# Patient Record
Sex: Male | Born: 1955 | Race: White | Hispanic: No | Marital: Married | Smoking: Former smoker
Health system: Southern US, Community
[De-identification: ages and names within clinical notes are randomized; demographics above are authoritative.]

## PROBLEM LIST (undated history)

## (undated) DIAGNOSIS — I499 Cardiac arrhythmia, unspecified: Secondary | ICD-10-CM

## (undated) DIAGNOSIS — T4145XA Adverse effect of unspecified anesthetic, initial encounter: Secondary | ICD-10-CM

## (undated) DIAGNOSIS — T8859XA Other complications of anesthesia, initial encounter: Secondary | ICD-10-CM

## (undated) DIAGNOSIS — F419 Anxiety disorder, unspecified: Secondary | ICD-10-CM

## (undated) DIAGNOSIS — K2990 Gastroduodenitis, unspecified, without bleeding: Secondary | ICD-10-CM

## (undated) DIAGNOSIS — Z8371 Family history of colonic polyps: Secondary | ICD-10-CM

## (undated) DIAGNOSIS — M5431 Sciatica, right side: Secondary | ICD-10-CM

## (undated) DIAGNOSIS — K219 Gastro-esophageal reflux disease without esophagitis: Secondary | ICD-10-CM

## (undated) DIAGNOSIS — R002 Palpitations: Secondary | ICD-10-CM

## (undated) DIAGNOSIS — M1990 Unspecified osteoarthritis, unspecified site: Secondary | ICD-10-CM

## (undated) DIAGNOSIS — Z83719 Family history of colon polyps, unspecified: Secondary | ICD-10-CM

## (undated) DIAGNOSIS — J309 Allergic rhinitis, unspecified: Secondary | ICD-10-CM

## (undated) DIAGNOSIS — F32A Depression, unspecified: Secondary | ICD-10-CM

## (undated) DIAGNOSIS — K269 Duodenal ulcer, unspecified as acute or chronic, without hemorrhage or perforation: Secondary | ICD-10-CM

## (undated) DIAGNOSIS — S0285XA Fracture of orbit, unspecified, initial encounter for closed fracture: Secondary | ICD-10-CM

## (undated) DIAGNOSIS — K649 Unspecified hemorrhoids: Secondary | ICD-10-CM

## (undated) DIAGNOSIS — F329 Major depressive disorder, single episode, unspecified: Secondary | ICD-10-CM

## (undated) DIAGNOSIS — R000 Tachycardia, unspecified: Secondary | ICD-10-CM

## (undated) HISTORY — PX: FRACTURE SURGERY: SHX138

## (undated) HISTORY — PX: TIBIA FRACTURE SURGERY: SHX806

## (undated) HISTORY — PX: HERNIA REPAIR: SHX51

## (undated) HISTORY — PX: CARDIAC CATHETERIZATION: SHX172

## (undated) HISTORY — PX: CARPAL TUNNEL RELEASE: SHX101

## (undated) HISTORY — PX: VASECTOMY: SHX75

## (undated) HISTORY — PX: KNEE ARTHROSCOPY: SHX127

---

## 2003-05-30 ENCOUNTER — Other Ambulatory Visit: Payer: Self-pay

## 2004-12-22 ENCOUNTER — Ambulatory Visit: Payer: Self-pay | Admitting: Internal Medicine

## 2005-12-28 ENCOUNTER — Ambulatory Visit: Payer: Self-pay | Admitting: Internal Medicine

## 2006-04-24 ENCOUNTER — Inpatient Hospital Stay: Payer: Self-pay | Admitting: Unknown Physician Specialty

## 2008-06-20 ENCOUNTER — Ambulatory Visit: Payer: Self-pay | Admitting: Unknown Physician Specialty

## 2012-09-27 ENCOUNTER — Ambulatory Visit: Payer: Self-pay | Admitting: Unknown Physician Specialty

## 2013-03-20 ENCOUNTER — Ambulatory Visit: Payer: Self-pay | Admitting: Specialist

## 2013-03-30 ENCOUNTER — Emergency Department: Payer: Self-pay | Admitting: Emergency Medicine

## 2013-03-30 ENCOUNTER — Ambulatory Visit: Payer: Self-pay | Admitting: Orthopaedic Surgery

## 2013-03-30 LAB — CBC WITH DIFFERENTIAL/PLATELET
Basophil #: 0.1 10*3/uL (ref 0.0–0.1)
Basophil %: 1 %
Eosinophil %: 6.6 %
HCT: 45 % (ref 40.0–52.0)
HGB: 15.3 g/dL (ref 13.0–18.0)
Monocyte #: 0.6 x10 3/mm (ref 0.2–1.0)
Monocyte %: 8.5 %
Neutrophil %: 57.7 %
Platelet: 176 10*3/uL (ref 150–440)
RBC: 4.84 10*6/uL (ref 4.40–5.90)
RDW: 13.1 % (ref 11.5–14.5)

## 2013-03-30 LAB — BASIC METABOLIC PANEL
BUN: 16 mg/dL (ref 7–18)
Calcium, Total: 10 mg/dL (ref 8.5–10.1)
Chloride: 106 mmol/L (ref 98–107)
Co2: 28 mmol/L (ref 21–32)
EGFR (Non-African Amer.): >60
Glucose: 91 mg/dL (ref 65–99)
Potassium: 4.2 mmol/L (ref 3.5–5.1)
Sodium: 138 mmol/L (ref 136–145)

## 2013-03-30 LAB — SEDIMENTATION RATE: Erythrocyte Sed Rate: 3 mm/hr (ref 0–20)

## 2013-08-25 ENCOUNTER — Ambulatory Visit: Payer: Self-pay | Admitting: Unknown Physician Specialty

## 2013-08-30 LAB — PATHOLOGY REPORT

## 2013-10-20 DIAGNOSIS — M5030 Other cervical disc degeneration, unspecified cervical region: Secondary | ICD-10-CM | POA: Insufficient documentation

## 2013-10-20 DIAGNOSIS — M5412 Radiculopathy, cervical region: Secondary | ICD-10-CM | POA: Insufficient documentation

## 2013-10-20 DIAGNOSIS — M62838 Other muscle spasm: Secondary | ICD-10-CM | POA: Insufficient documentation

## 2013-10-23 DIAGNOSIS — K2990 Gastroduodenitis, unspecified, without bleeding: Secondary | ICD-10-CM | POA: Insufficient documentation

## 2013-10-23 DIAGNOSIS — Z8371 Family history of colonic polyps: Secondary | ICD-10-CM | POA: Insufficient documentation

## 2013-10-23 DIAGNOSIS — K269 Duodenal ulcer, unspecified as acute or chronic, without hemorrhage or perforation: Secondary | ICD-10-CM | POA: Insufficient documentation

## 2014-01-16 ENCOUNTER — Emergency Department: Payer: Self-pay | Admitting: Emergency Medicine

## 2014-01-17 LAB — URINALYSIS, COMPLETE
BILIRUBIN, UR: NEGATIVE
BLOOD: NEGATIVE
Glucose,UR: NEGATIVE mg/dL (ref 0–75)
Ketone: NEGATIVE
Leukocyte Esterase: NEGATIVE
NITRITE: NEGATIVE
Ph: 7 (ref 4.5–8.0)
Protein: NEGATIVE
RBC,UR: <1 /HPF (ref 0–5)
Specific Gravity: 1.013 (ref 1.003–1.030)
Squamous Epithelial: NONE SEEN

## 2014-04-03 ENCOUNTER — Ambulatory Visit: Payer: Self-pay | Admitting: Unknown Physician Specialty

## 2014-08-24 NOTE — Consult Note (Signed)
CC: Right Draining Carpal Tunnel Wound 59 y/o man who had right carpal tunnel release on Nov 17 bu Dr Tamala Julian comes to the ER with an open draining wound.  He states that this past monday he was seen in clinic and found to have some increased redness and drainage.  He was placed on Keflex and sent out.  He call this am with increased concern for infection as his wound had opened up and the stiches were no longer holding the woound closed.  He denies any fevers of chills, but does state that serious wound drainage has been coming out of the wound.  He states that he continues to have some carpal tunnel type symptoms at night.  He denies any significant pain.  He does state that he showered with the wound within 24 hrs as directed and has been using his hand in a removable wrist splint. anxiey, cardiac cath 2001, GERD Right carpal tunnel release; ORIF Left Lef fx 2007 Ultram hx: married Hx: non contributaory Medsoil40mg  PO BIDwith codeine #3 1 tab po q4 hrs gen neg, eyes, neg, ent, neg, gi +gerd, GU neg, cv neg, psych depression, neuro neg  wbc 6.7, H/H 15.3 and 45.0138, k 4.2  is 3. temp 98; pulse 68; RR 18 spo 02 96 on room air3/10 190 cm a and ox3 nadregular ratenon labored respirationsSILT C5-T1, able to x fingers, make ok sign, grip hand, +2 radial pulseis op with min serious drainage, no purrulent drainage, no foul smell; some erythemia no extensivley red carpal tunnel incision after CTR on nov 17th.was ordered, but this is a send outhas a followup Monday with Dr. Mahlon Gammon change antibiotics to cover CA-MRSADS BID; stop Keflex to dry daily dressing changes up with Dr. Tamala Julian on Monday   Electronic Signatures: Mary Sella (MD)  (Signed on 27-Nov-14 10:18)  Authored  Last Updated: 27-Nov-14 10:18 by Mary Sella (MD)

## 2014-08-24 NOTE — Op Note (Signed)
PATIENT NAME:  Defrank, Visente W MR#:  [id number] DATE OF BIRTH:  [birth year]  DATE OF PROCEDURE:  03/20/2013  PREOPERATIVE DIAGNOSIS: Right carpal tunnel syndrome.   POSTOPERATIVE DIAGNOSIS: Right carpal tunnel syndrome.   PROCEDURE: Right carpal tunnel release.   SURGEON: Christophe Louis, M.D.   ANESTHESIA: General.   COMPLICATIONS: None.   TOURNIQUET TIME: 16 minutes.   PROCEDURE IN DETAIL: After adequate induction of general anesthesia,  the right upper extremity is thoroughly prepped with alcohol and ChloraPrep and draped in standard sterile fashion. The extremity is wrapped out with the Esmarch bandage and pneumatic tourniquet elevated to 250 mmHg. Under loupe magnification, standard volar carpal tunnel incision is made and the dissection carried down to the transverse retinacular ligament. Under direct vision,  this was incised in the midportion. The distal release is performed carefully with the small scissors and a proximal release is performed carefully with the small scissors and the carpal tunnel scissors. Careful check is made both distally and proximally to ensure that complete release had been obtained. There is seen to be moderate compression of the nerve directly beneath the ligament. The wound is thoroughly irrigated multiple times. Skin edges are infiltrated with 0.5% plain Marcaine. The skin is closed with 4-0 nylon. A soft bulky dressing is applied. The tourniquet is released and the patient is returned to the recovery room in satisfactory condition having tolerated the procedure quite well.   ____________________________ Lucas Mallow, MD ces:aw D: 03/20/2013 13:40:25 ET T: 03/20/2013 14:12:49 ET JOB#: [id number]  cc: Lucas Mallow, MD, <Dictator> Lucas Mallow MD ELECTRONICALLY SIGNED 03/23/2013 6:51

## 2015-03-14 DIAGNOSIS — D239 Other benign neoplasm of skin, unspecified: Secondary | ICD-10-CM

## 2015-03-14 HISTORY — DX: Other benign neoplasm of skin, unspecified: D23.9

## 2015-04-17 ENCOUNTER — Other Ambulatory Visit: Payer: Self-pay | Admitting: Unknown Physician Specialty

## 2015-04-17 DIAGNOSIS — K824 Cholesterolosis of gallbladder: Secondary | ICD-10-CM

## 2015-04-25 ENCOUNTER — Ambulatory Visit
Admission: RE | Admit: 2015-04-25 | Discharge: 2015-04-25 | Disposition: A | Discharge status: Home / Self Care | Payer: 59 | Source: Ambulatory Visit | Attending: Unknown Physician Specialty | Admitting: Unknown Physician Specialty

## 2015-04-25 ENCOUNTER — Ambulatory Visit: Payer: Self-pay

## 2015-04-25 DIAGNOSIS — K824 Cholesterolosis of gallbladder: Secondary | ICD-10-CM | POA: Diagnosis present

## 2016-01-22 ENCOUNTER — Other Ambulatory Visit: Payer: Self-pay

## 2016-01-23 ENCOUNTER — Ambulatory Visit (INDEPENDENT_AMBULATORY_CARE_PROVIDER_SITE_OTHER): Payer: 59 | Admitting: Surgery

## 2016-01-23 ENCOUNTER — Other Ambulatory Visit
Admission: RE | Admit: 2016-01-23 | Discharge: 2016-01-23 | Disposition: A | Discharge status: Home / Self Care | Payer: 59 | Source: Ambulatory Visit | Attending: Surgery | Admitting: Surgery

## 2016-01-23 ENCOUNTER — Encounter: Payer: Self-pay | Admitting: Surgery

## 2016-01-23 ENCOUNTER — Telehealth: Payer: Self-pay

## 2016-01-23 VITALS — BP 145/99 | HR 61 | Temp 98.1°F | Ht 74.0 in | Wt 199.0 lb

## 2016-01-23 DIAGNOSIS — Z7982 Long term (current) use of aspirin: Secondary | ICD-10-CM | POA: Diagnosis not present

## 2016-01-23 DIAGNOSIS — Z87891 Personal history of nicotine dependence: Secondary | ICD-10-CM | POA: Diagnosis not present

## 2016-01-23 DIAGNOSIS — K4090 Unilateral inguinal hernia, without obstruction or gangrene, not specified as recurrent: Secondary | ICD-10-CM | POA: Diagnosis not present

## 2016-01-23 DIAGNOSIS — Z79899 Other long term (current) drug therapy: Secondary | ICD-10-CM | POA: Diagnosis not present

## 2016-01-23 DIAGNOSIS — Z01812 Encounter for preprocedural laboratory examination: Secondary | ICD-10-CM | POA: Insufficient documentation

## 2016-01-23 DIAGNOSIS — Z8249 Family history of ischemic heart disease and other diseases of the circulatory system: Secondary | ICD-10-CM | POA: Insufficient documentation

## 2016-01-23 DIAGNOSIS — Z888 Allergy status to other drugs, medicaments and biological substances status: Secondary | ICD-10-CM | POA: Insufficient documentation

## 2016-01-23 DIAGNOSIS — K4020 Bilateral inguinal hernia, without obstruction or gangrene, not specified as recurrent: Secondary | ICD-10-CM

## 2016-01-23 DIAGNOSIS — K4091 Unilateral inguinal hernia, without obstruction or gangrene, recurrent: Secondary | ICD-10-CM | POA: Diagnosis not present

## 2016-01-23 DIAGNOSIS — Z833 Family history of diabetes mellitus: Secondary | ICD-10-CM | POA: Insufficient documentation

## 2016-01-23 LAB — COMPREHENSIVE METABOLIC PANEL
ALBUMIN: 4.5 g/dL (ref 3.5–5.0)
ALK PHOS: 82 U/L (ref 38–126)
ALT: 22 U/L (ref 17–63)
ANION GAP: 7 (ref 5–15)
AST: 16 U/L (ref 15–41)
BILIRUBIN TOTAL: 0.6 mg/dL (ref 0.3–1.2)
BUN: 18 mg/dL (ref 6–20)
CALCIUM: 10.1 mg/dL (ref 8.9–10.3)
CO2: 29 mmol/L (ref 22–32)
Chloride: 102 mmol/L (ref 101–111)
Creatinine, Ser: 0.87 mg/dL (ref 0.61–1.24)
GFR calc non Af Amer: >60 mL/min (ref >60)
GLUCOSE: 99 mg/dL (ref 65–99)
Potassium: 3.8 mmol/L (ref 3.5–5.1)
Sodium: 138 mmol/L (ref 135–145)
TOTAL PROTEIN: 7.6 g/dL (ref 6.5–8.1)

## 2016-01-23 LAB — URINALYSIS COMPLETE WITH MICROSCOPIC (ARMC ONLY)
BILIRUBIN URINE: NEGATIVE
Bacteria, UA: NONE SEEN
GLUCOSE, UA: NEGATIVE mg/dL
HGB URINE DIPSTICK: NEGATIVE
KETONES UR: NEGATIVE mg/dL
LEUKOCYTES UA: NEGATIVE
Nitrite: NEGATIVE
Protein, ur: NEGATIVE mg/dL
RBC / HPF: NONE SEEN RBC/hpf (ref 0–5)
SPECIFIC GRAVITY, URINE: 1.015 (ref 1.005–1.030)
Squamous Epithelial / LPF: NONE SEEN
pH: 6 (ref 5.0–8.0)

## 2016-01-23 LAB — CBC WITH DIFFERENTIAL/PLATELET
Basophils Absolute: 0.2 10*3/uL — ABNORMAL HIGH (ref 0–0.1)
Basophils Relative: 2 %
Eosinophils Absolute: 0.5 10*3/uL (ref 0–0.7)
Eosinophils Relative: 7 %
HEMATOCRIT: 45.7 % (ref 40.0–52.0)
HEMOGLOBIN: 15.6 g/dL (ref 13.0–18.0)
LYMPHS ABS: 2.3 10*3/uL (ref 1.0–3.6)
LYMPHS PCT: 30 %
MCH: 31.7 pg (ref 26.0–34.0)
MCHC: 34.2 g/dL (ref 32.0–36.0)
MCV: 92.6 fL (ref 80.0–100.0)
MONOS PCT: 8 %
Monocytes Absolute: 0.6 10*3/uL (ref 0.2–1.0)
NEUTROS ABS: 4.1 10*3/uL (ref 1.4–6.5)
NEUTROS PCT: 53 %
Platelets: 220 10*3/uL (ref 150–440)
RBC: 4.94 MIL/uL (ref 4.40–5.90)
RDW: 13.4 % (ref 11.5–14.5)
WBC: 7.7 10*3/uL (ref 3.8–10.6)

## 2016-01-23 NOTE — Patient Instructions (Addendum)
You have chose to have your hernia repaired. This will be done by Dr. Azalee Course on 03/15/16 at [facility].  Please see your (blue) Pre-care information that you have been given today.  You will need to arrange to be out of work for 2 weeks and then return with a lifting restrictions for 4 more weeks. Please send any FMLA paperwork prior to surgery and we will fill this out and fax it back to your employer within 3 business days.  You may have a bruise in your groin and also swelling and brusing in your testicle area. You may use ice 4-5 times daily for 15-20 minutes each time. Make sure that you place a barrier between you and the ice pack.  Inguinal Hernia, Adult Muscles help keep everything in the body in its proper place. But if a weak spot in the muscles develops, something can poke through. That is called a hernia. When this happens in the lower part of the belly (abdomen), it is called an inguinal hernia. (It takes its name from a part of the body in this region called the inguinal canal.) A weak spot in the wall of muscles lets some fat or part of the small intestine bulge through. An inguinal hernia can develop at any age. Men get them more often than women. CAUSES  In adults, an inguinal hernia develops over time.  It can be triggered by:  Suddenly straining the muscles of the lower abdomen.  Lifting heavy objects.  Straining to have a bowel movement. Difficult bowel movements (constipation) can lead to this.  Constant coughing. This may be caused by smoking or lung disease.  Being overweight.  Being pregnant.  Working at a job that requires long periods of standing or heavy lifting.  Having had an inguinal hernia before. One type can be an emergency situation. It is called a strangulated inguinal hernia. It develops if part of the small intestine slips through the weak spot and cannot get back into the abdomen. The blood supply can be cut off. If that happens, part of the intestine may  die. This situation requires emergency surgery. SYMPTOMS  Often, a small inguinal hernia has no symptoms. It is found when a healthcare provider does a physical exam. Larger hernias usually have symptoms.   In adults, symptoms may include:  A lump in the groin. This is easier to see when the person is standing. It might disappear when lying down.  In men, a lump in the scrotum.  Pain or burning in the groin. This occurs especially when lifting, straining or coughing.  A dull ache or feeling of pressure in the groin.  Signs of a strangulated hernia can include:  A bulge in the groin that becomes very painful and tender to the touch.  A bulge that turns red or purple.  Fever, nausea and vomiting.  Inability to have a bowel movement or to pass gas. DIAGNOSIS  To decide if you have an inguinal hernia, a healthcare provider will probably do a physical examination.  This will include asking questions about any symptoms you have noticed.  The healthcare provider might feel the groin area and ask you to cough. If an inguinal hernia is felt, the healthcare provider may try to slide it back into the abdomen.  Usually no other tests are needed. TREATMENT  Treatments can vary. The size of the hernia makes a difference. Options include:  Watchful waiting. This is often suggested if the hernia is small and you  have had no symptoms.  No medical procedure will be done unless symptoms develop.  You will need to watch closely for symptoms. If any occur, contact your healthcare provider right away.  Surgery. This is used if the hernia is larger or you have symptoms.  Open surgery. This is usually an outpatient procedure (you will not stay overnight in a hospital). An cut (incision) is made through the skin in the groin. The hernia is put back inside the abdomen. The weak area in the muscles is then repaired by herniorrhaphy or hernioplasty. Herniorrhaphy: in this type of surgery, the weak  muscles are sewn back together. Hernioplasty: a patch or mesh is used to close the weak area in the abdominal wall.  Laparoscopy. In this procedure, a surgeon makes small incisions. A thin tube with a tiny video camera (called a laparoscope) is put into the abdomen. The surgeon repairs the hernia with mesh by looking with the video camera and using two long instruments. HOME CARE INSTRUCTIONS   After surgery to repair an inguinal hernia:  You will need to take pain medicine prescribed by your healthcare provider. Follow all directions carefully.  You will need to take care of the wound from the incision.  Your activity will be restricted for awhile. This will probably include no heavy lifting for several weeks. You also should not do anything too active for a few weeks. When you can return to work will depend on the type of job that you have.  During "watchful waiting" periods, you should:  Maintain a healthy weight.  Eat a diet high in fiber (fruits, vegetables and whole grains).  Drink plenty of fluids to avoid constipation. This means drinking enough water and other liquids to keep your urine clear or pale yellow.  Do not lift heavy objects.  Do not stand for long periods of time.  Quit smoking. This should keep you from developing a frequent cough. SEEK MEDICAL CARE IF:   A bulge develops in your groin area.  You feel pain, a burning sensation or pressure in the groin. This might be worse if you are lifting or straining.  You develop a fever of more than 100.5 F (38.1 C). SEEK IMMEDIATE MEDICAL CARE IF:   Pain in the groin increases suddenly.  A bulge in the groin gets bigger suddenly and does not go down.  For men, there is sudden pain in the scrotum. Or, the size of the scrotum increases.  A bulge in the groin area becomes red or purple and is painful to touch.  You have nausea or vomiting that does not go away.  You feel your heart beating much faster than  normal.  You cannot have a bowel movement or pass gas.  You develop a fever of more than 102.0 F (38.9 C).   This information is not intended to replace advice given to you by your health care provider. Make sure you discuss any questions you have with your health care provider.   Document Released: 09/06/2008 Document Revised: 07/13/2011 Document Reviewed: 10/22/2014 Elsevier Interactive Patient Education [employer].

## 2016-01-23 NOTE — Progress Notes (Signed)
Subjective:     Patient ID: Bradley Barker, male   DOB: [birth year], 60 y.o.   MRN: [id number]  HPI  60 year old man who has been noticing some pain in his back going around to his groin for more than 1 week. Patient states that he did ride in a car down the [place] for 10 hours and did have some back pain as well as left side that he noticed approximately 2 weeks ago. Patient states that since that time he's had pain down into his left groin as well. Patient was seen by his primary care physician who noted that he had a left inguinal hernia and he was referred here. Patient states that this pain has continued is not improved. Patient denies any nausea vomiting is having normal bowel movements he's eating and drinking well he has not had any pain or burning with urination he has also not noticed any difficulty starting or stopping. Patient has had no fevers or chills. Patient has not noticed that there is discrete bulge there just the pain in the area.  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 Kidney cancer Mother   . Heart disease Father   . Heart attack Father   . Ovarian cancer Sister   . Diabetes Brother    Social History   Social History  . Marital status: Married    Spouse name: N/A  . Number of children: N/A  . Years of education: N/A   Social History Main Topics  . Smoking status: Former Smoker    Types: Cigarettes    Quit date: 01/23/2011  . Smokeless tobacco: Never  Used  . Alcohol use Yes     Comment: 6 beers weekly  . Drug use: No  . Sexual activity: Not Asked   Other Topics Concern  . None   Social History Narrative  . None    Current Outpatient Prescriptions:  .  amitriptyline (ELAVIL) 25 MG tablet, Take by mouth., Disp: , Rfl:  .  aspirin EC 81 MG tablet, Take by mouth., Disp: , Rfl:  .  doxycycline (VIBRA-TABS) 100 MG tablet, Take by mouth., Disp: , Rfl:  .  fexofenadine (ALLEGRA) 180 MG tablet, Take by mouth., Disp: , Rfl:  .  guaiFENesin-codeine (ROBITUSSIN AC) 100-10 MG/5ML syrup, Take by mouth., Disp: , Rfl:  .  Omega-3 1000 MG CAPS, Take by mouth., Disp: , Rfl:  .  pregabalin (LYRICA) 150 MG capsule, Take by mouth., Disp: , Rfl:  .  propranolol (INDERAL) 40 MG tablet, Take by mouth., Disp: , Rfl:  Allergies  Allergen Reactions  . Cyclobenzaprine Other (See Comments)    Lip swelling  [Onset: 06/28/2012]  . Omeprazole Other (See Comments)  . Tramadol Swelling     [Onset: 06/21/2012]    Review of Systems  Constitutional: Negative for activity change, chills, fatigue and fever.  HENT: Negative for congestion and sore throat.   Respiratory: Negative for cough, choking, chest tightness, shortness of  breath and stridor.   Cardiovascular: Negative for chest pain, palpitations and leg swelling.  Gastrointestinal: Negative for abdominal pain, constipation, nausea and vomiting.  Genitourinary: Positive for flank pain. Negative for dysuria and hematuria.  Musculoskeletal: Positive for back pain.  Skin: Negative for color change, pallor and rash.  Neurological: Negative for dizziness and weakness.  Hematological: Negative for adenopathy. Does not bruise/bleed easily.  Psychiatric/Behavioral: Negative for agitation. The patient is not nervous/anxious.   All other systems reviewed and are negative.      Vitals:   01/23/16 1307  BP: (!) 145/99  Pulse: 61  Temp: 98.1 F (36.7 C)    Objective:   Physical Exam  Constitutional: He  is oriented to person, place, and time. He appears well-developed and well-nourished. No distress.  HENT:  Head: Normocephalic and atraumatic.  Right Ear: External ear normal.  Left Ear: External ear normal.  Nose: Nose normal.  Mouth/Throat: Oropharynx is clear and moist. No oropharyngeal exudate.  Eyes: Conjunctivae and EOM are normal. Pupils are equal, round, and reactive to light. No scleral icterus.  Neck: Normal range of motion. Neck supple. No tracheal deviation present.  Cardiovascular: Normal rate, regular rhythm, normal heart sounds and intact distal pulses.  Exam reveals no gallop and no friction rub.   No murmur heard. Pulmonary/Chest: Effort normal and breath sounds normal. No respiratory distress. He has no wheezes. He has no rales.  Abdominal: Soft. Bowel sounds are normal. He exhibits no distension. There is no tenderness. There is no rebound.  Bilateral mild CVA tenderness  Genitourinary:  Genitourinary Comments: BIlateral groin: with weakness in abdominal wall with strain and cough measuring about 2cm on both sides, tender on left side and around pubic tubercle and inguinal ligament  Musculoskeletal: Normal range of motion. He exhibits no edema, tenderness or deformity.  Neurological: He is alert and oriented to person, place, and time.  Skin: Skin is warm and dry. No rash noted. No erythema. No pallor.  Psychiatric: He has a normal mood and affect. His behavior is normal. Judgment and thought content normal.  Vitals reviewed.      CBC Latest Ref Rng & Units 01/23/2016 03/30/2013  WBC 3.8 - 10.6 K/uL 7.7 6.7  Hemoglobin 13.0 - 18.0 g/dL 40.9 81.1  Hematocrit 91.4 - 52.0 % 45.7 45.0  Platelets 150 - 440 K/uL 220 176   CMP Latest Ref Rng & Units 01/23/2016 03/30/2013  Glucose 65 - 99 mg/dL 99 91  BUN 6 - 20 mg/dL 18 16  Creatinine 7.82 - 1.24 mg/dL 9.56 2.13  Sodium 086 - 145 mmol/L 138 138  Potassium 3.5 - 5.1 mmol/L 3.8 4.2  Chloride 101 - 111 mmol/L 102 106  CO2  22 - 32 mmol/L 29 28  Calcium 8.9 - 10.3 mg/dL 57.8 46.9  Total Protein 6.5 - 8.1 g/dL 7.6 -  Total Bilirubin 0.3 - 1.2 mg/dL 0.6 -  Alkaline Phos 38 - 126 U/L 82 -  AST 15 - 41 U/L 16 -  ALT 17 - 63 U/L 22 -   U/A: neg Assessment:     60 year old male with bilateral inguinal hernias.    Plan:     I reviewed the patient's past medical history which includes some duodenitis and duodenal ulcer as well as some cervical back pain. Patient has not ever had any lower back pain previously. Or any history kidney stones. I personally reviewed the patient's laboratory values which include a normal white blood cell count and normal electrolytes as  well as a normal UA without any blood or bacteria seen. Because of these normal findings on his laboratory values to do believe that he strained his back couple weeks back that he does not currently have a UTI or kidney stone since there is no blood in the urine. He does however have bilateral inguinal hernias the right side which is recurrent. Patient is unsure whether or not they put mesh on that side back in the 90s and there is no record of this in our system.  I discussed possibility of incarceration, strangulation, enlargement in size over time, and the risk of emergency surgery in the face of strangulation.  Also discussed the risk of surgery including recurrence which can be up to 30% in the case of complex hernias, use of prosthetic materials (mesh) and the increased risk of infxn, post-op infxn and the possible need for re-operation and removal of mesh if used, possibility of post-op SBO or ileus, and the risks of general anesthetic including MI, CVA, sudden death or even reaction to anesthetic medications. The patient the risks, any and all questions were answered to the patient's satisfaction.  I will set him up for Laparoscopic Bilateral Inguinal hernia repair with mesh in late October/early November since his wife is out of town until that time.

## 2016-01-23 NOTE — Telephone Encounter (Signed)
Patient notified of normal labs.Patient verbalized understanding.

## 2016-01-25 LAB — URINE CULTURE: CULTURE: NO GROWTH

## 2016-01-27 ENCOUNTER — Telehealth: Payer: Self-pay | Admitting: Surgery

## 2016-01-27 NOTE — Telephone Encounter (Signed)
Pt advised of pre op date/time and sx date. Sx: 02/13/16 with Dr Azalee Course @ [facility]--Laparoscopic bilateral inguinal hernia repair with mesh.  Pre op: 02/06/16 between 9-1:00pm--Phone.   Patient made aware to call [phone], between 1-3:00pm the day before surgery, to find out what time to arrive.

## 2016-02-06 ENCOUNTER — Other Ambulatory Visit: Payer: Self-pay

## 2016-02-06 ENCOUNTER — Encounter
Admission: RE | Admit: 2016-02-06 | Discharge: 2016-02-06 | Disposition: A | Discharge status: Home / Self Care | Payer: 59 | Source: Ambulatory Visit | Attending: Surgery | Admitting: Surgery

## 2016-02-06 DIAGNOSIS — K4020 Bilateral inguinal hernia, without obstruction or gangrene, not specified as recurrent: Secondary | ICD-10-CM

## 2016-02-06 HISTORY — DX: Gastro-esophageal reflux disease without esophagitis: K21.9

## 2016-02-06 HISTORY — DX: Unspecified osteoarthritis, unspecified site: M19.90

## 2016-02-06 HISTORY — DX: Adverse effect of unspecified anesthetic, initial encounter: T41.45XA

## 2016-02-06 HISTORY — DX: Cardiac arrhythmia, unspecified: I49.9

## 2016-02-06 HISTORY — DX: Anxiety disorder, unspecified: F41.9

## 2016-02-06 HISTORY — DX: Other complications of anesthesia, initial encounter: T88.59XA

## 2016-02-06 NOTE — Patient Instructions (Addendum)
  Your procedure is scheduled on: 02-13-16 [facility]) Report to Same Day Surgery 2nd floor medical mall To find out your arrival time please call [phone] between [place] on 02-12-16 [facility])  Remember: Instructions that are not followed completely may result in serious medical risk, up to and including death, or upon the discretion of your surgeon and anesthesiologist your surgery may need to be rescheduled.    _x___ 1. Do not eat food or drink liquids after midnight. No gum chewing or hard candies.     __x__ 2. No Alcohol for 24 hours before or after surgery.   __x__3. No Smoking for 24 prior to surgery.   ____  4. Bring all medications with you on the day of surgery if instructed.    __x__ 5. Notify your doctor if there is any change in your medical condition     (cold, fever, infections).     Do not wear jewelry, make-up, hairpins, clips or nail polish.  Do not wear lotions, powders, or perfumes. You may wear deodorant.  Do not shave 48 hours prior to surgery. Men may shave face and neck.  Do not bring valuables to the hospital.    [facility] is not responsible for any belongings or valuables.               Contacts, dentures or bridgework may not be worn into surgery.  Leave your suitcase in the car. After surgery it may be brought to your room.  For patients admitted to the hospital, discharge time is determined by your treatment team.   Patients discharged the day of surgery will not be allowed to drive home.    Please read over the following fact sheets that you were given:   [facility] Preparing for Surgery and or MRSA Information   _x___ Take these medicines the morning of surgery with A SIP OF WATER:    1. PROPRANOLOL  2. OMEPRAZOLE  3. TAKE AN OMEPRAZOLE Thursday NIGHT BEFORE BED  4.  5.  6.  ____Fleets enema or Magnesium Citrate as directed.   _x___ Use CHG Soap or sage wipes as directed on instruction sheet   ____ Use inhalers on the day of  surgery and bring to hospital day of surgery  ____ Stop metformin 2 days prior to surgery    ____ Take 1/2 of usual insulin dose the night before surgery and none on the morning of  surgery.   _X___ Stop aspirin or coumadin, or plavix-STOP ASPIRIN NOW  x__ Stop Anti-inflammatories such as Advil, Aleve, Ibuprofen, Motrin, Naproxen,          Naprosyn, Goodies powders or aspirin products. Ok to take Tylenol.   __X__ Stop supplements until after surgery-STOP FISH OIL NOW ____ Bring C-Pap to the hospital.

## 2016-02-07 ENCOUNTER — Inpatient Hospital Stay: Admission: RE | Admit: 2016-02-07 | Payer: 59 | Source: Ambulatory Visit

## 2016-02-10 ENCOUNTER — Telehealth: Payer: Self-pay

## 2016-02-10 ENCOUNTER — Encounter
Admission: RE | Admit: 2016-02-10 | Discharge: 2016-02-10 | Disposition: A | Discharge status: Home / Self Care | Payer: 59 | Source: Ambulatory Visit | Attending: Surgery | Admitting: Surgery

## 2016-02-10 DIAGNOSIS — Z8051 Family history of malignant neoplasm of kidney: Secondary | ICD-10-CM | POA: Diagnosis not present

## 2016-02-10 DIAGNOSIS — Z8371 Family history of colonic polyps: Secondary | ICD-10-CM | POA: Diagnosis not present

## 2016-02-10 DIAGNOSIS — Z833 Family history of diabetes mellitus: Secondary | ICD-10-CM | POA: Diagnosis not present

## 2016-02-10 DIAGNOSIS — Z888 Allergy status to other drugs, medicaments and biological substances status: Secondary | ICD-10-CM | POA: Diagnosis not present

## 2016-02-10 DIAGNOSIS — M5010 Cervical disc disorder with radiculopathy, unspecified cervical region: Secondary | ICD-10-CM | POA: Diagnosis not present

## 2016-02-10 DIAGNOSIS — M1990 Unspecified osteoarthritis, unspecified site: Secondary | ICD-10-CM | POA: Diagnosis not present

## 2016-02-10 DIAGNOSIS — Z8041 Family history of malignant neoplasm of ovary: Secondary | ICD-10-CM | POA: Diagnosis not present

## 2016-02-10 DIAGNOSIS — K2980 Duodenitis without bleeding: Secondary | ICD-10-CM | POA: Diagnosis not present

## 2016-02-10 DIAGNOSIS — J449 Chronic obstructive pulmonary disease, unspecified: Secondary | ICD-10-CM | POA: Diagnosis not present

## 2016-02-10 DIAGNOSIS — Z7982 Long term (current) use of aspirin: Secondary | ICD-10-CM | POA: Diagnosis not present

## 2016-02-10 DIAGNOSIS — Z885 Allergy status to narcotic agent status: Secondary | ICD-10-CM | POA: Diagnosis not present

## 2016-02-10 DIAGNOSIS — K279 Peptic ulcer, site unspecified, unspecified as acute or chronic, without hemorrhage or perforation: Secondary | ICD-10-CM | POA: Diagnosis not present

## 2016-02-10 DIAGNOSIS — Z8249 Family history of ischemic heart disease and other diseases of the circulatory system: Secondary | ICD-10-CM | POA: Diagnosis not present

## 2016-02-10 DIAGNOSIS — K2970 Gastritis, unspecified, without bleeding: Secondary | ICD-10-CM | POA: Diagnosis not present

## 2016-02-10 DIAGNOSIS — K4020 Bilateral inguinal hernia, without obstruction or gangrene, not specified as recurrent: Secondary | ICD-10-CM | POA: Diagnosis not present

## 2016-02-10 DIAGNOSIS — K219 Gastro-esophageal reflux disease without esophagitis: Secondary | ICD-10-CM | POA: Diagnosis not present

## 2016-02-10 DIAGNOSIS — Z87891 Personal history of nicotine dependence: Secondary | ICD-10-CM | POA: Diagnosis not present

## 2016-02-10 NOTE — Telephone Encounter (Signed)
Spoke with patient at this time. I explained about how hernia repairs are done and what this class action lawsuit that he is speaking of entails. He verbalizes understanding of conversation and still wishes to proceed with planned surgery as scheduled.

## 2016-02-10 NOTE — Pre-Procedure Instructions (Signed)
CALLED DR Ronelle Nigh REGARDING ABNORMAL EKG-PT NEEDING MEDICAL CLEARANCE- CALLED AMBER AT DR LOFLINS OFFICE AND INFORMED HER OF THIS AND FAXED OVER TO HER OFFICE WITH FAX CONFIRMATION RECEIVED- CALLED OVER TO DR BRONSTEINS OFFICE AND THEY HAVE ALREADY CLOSED FOR THE DAY- I DID FAX THIS OVER TO THEIR OFFICE AND RECEIVED FAX CONFIRMATION AND I WILL CALL THEIR OFFICE IN THE MORNING WHEN I RETURN TO WORK

## 2016-02-10 NOTE — Telephone Encounter (Signed)
I was informed by Nira Conn (Pre-admit) at this time that patient is in need of Medical Clearance for abnormal EKG. Please send clearance information over to Dr. Reuel Boom office.   Patient scheduled for Laparoscopic Bilateral Inguinal Hernia Repair with Dr. Azalee Course on 02/13/16.

## 2016-02-10 NOTE — Telephone Encounter (Signed)
Wilder was watching television last night and came across a commercial about Hernia's. The commercial was stating that the Mesh is a bad method. The commercial stated that having a mesh placed will cause you to undergo surgery again and it causes all of these other problems. He is scheduled to have a laparoscopic Bilateral Inguinal Hernia Repair on 02/13/2016 with Dr. Azalee Course and has some questions. He wants to know if this is something that used to happen but is now fixed. Please call him and answer these questions for him

## 2016-02-11 ENCOUNTER — Telehealth: Payer: Self-pay

## 2016-02-11 NOTE — Pre-Procedure Instructions (Signed)
CALLED OVER TO DR Yetta Numbers OFFICE AND SPOKE WITH RECEPTIONIST TO MAKE SURE THEY RECEIVED THE MEDICAL CLEARANCE INFO ON PT- RECEPTIONIST STATED THAT SHE TALKED WITH KIMBERLY WHO IS DR BRONSTEINS NURSE AND SHE WANTS ME TO FAX IT DIRECTLY TO HER(KIMBERLY)-DR BRONSTEIN IS NOT IN THE OFFICE TODAY AND SHE SAID IF I HAD ADDRESSED THE FAX COVER SHEET WITH DR Yetta Numbers NAME ON IT YESTERDAY WHEN I SENT IT THEN IT WOULD BE IN HIS OFFICE-I HAVE REFAXED IT ADDRESSED TO [place] WITH FAX CONFIRMATION RECEIVED

## 2016-02-11 NOTE — Telephone Encounter (Signed)
Medical Clearance faxed at this time to [facility]. EKG included.

## 2016-02-12 ENCOUNTER — Telehealth: Payer: Self-pay

## 2016-02-12 ENCOUNTER — Encounter: Payer: 59 | Admitting: Internal Medicine

## 2016-02-12 DIAGNOSIS — R9439 Abnormal result of other cardiovascular function study: Secondary | ICD-10-CM | POA: Insufficient documentation

## 2016-02-12 DIAGNOSIS — Z01810 Encounter for preprocedural cardiovascular examination: Secondary | ICD-10-CM | POA: Insufficient documentation

## 2016-02-12 NOTE — Pre-Procedure Instructions (Signed)
Medical clearance has been denied by Dr Lovie Macadamia per fax I received-Dr Lovie Macadamia wants pt to have Cardiac Clearance for new T wave inversions-  Called Freda Munro at Dr Geoffry Paradise  office and notified her of this-Sheila states that she will call pt and set him up with cardiologist-faxed denied clearance from Dr Lovie Macadamia over to to [place] at [place] and also spoke with Angie about this

## 2016-02-12 NOTE — Pre-Procedure Instructions (Signed)
SPOKE WITH DR ADAMS WHO SAID DR PARASCHOS WILL SEE PATIENT THIS AFTERNOON. VERIFIED WITH ANGIE AT DR LOFLIN'S HAS APPT 1615. PER DR Ridgway

## 2016-02-12 NOTE — Pre-Procedure Instructions (Signed)
CLEARED BY DR PARASCHOS. LM FOR PATIENT TO TAKE INDERAL IN AM WITH SIP OF WATER

## 2016-02-12 NOTE — Pre-Procedure Instructions (Signed)
Called over to Dr Yetta Numbers office to see where we are at on the medical clearance.  I spoke with receptionist who is relaying all this info again to [facility], Dr Reuel Boom nurse.  She took my # and said she would have his nurse call me.  I told receptionist that pts surgery is in am.

## 2016-02-12 NOTE — Telephone Encounter (Signed)
Cardiac Clearance faxed to Dr.Parascos at this time. Patient has an appointment today with Dr.Paraschos.

## 2016-02-13 ENCOUNTER — Ambulatory Visit
Admission: RE | Admit: 2016-02-13 | Discharge: 2016-02-13 | Disposition: A | Discharge status: Home / Self Care | Payer: 59 | Source: Ambulatory Visit | Attending: Surgery | Admitting: Surgery

## 2016-02-13 ENCOUNTER — Encounter
Admission: RE | Disposition: A | Discharge status: Home / Self Care | Payer: Self-pay | Source: Ambulatory Visit | Attending: Surgery

## 2016-02-13 ENCOUNTER — Encounter: Payer: Self-pay | Admitting: *Deleted

## 2016-02-13 ENCOUNTER — Ambulatory Visit: Payer: 59 | Admitting: Anesthesiology

## 2016-02-13 DIAGNOSIS — K219 Gastro-esophageal reflux disease without esophagitis: Secondary | ICD-10-CM | POA: Insufficient documentation

## 2016-02-13 DIAGNOSIS — Z833 Family history of diabetes mellitus: Secondary | ICD-10-CM | POA: Insufficient documentation

## 2016-02-13 DIAGNOSIS — K4001 Bilateral inguinal hernia, with obstruction, without gangrene, recurrent: Secondary | ICD-10-CM

## 2016-02-13 DIAGNOSIS — K2970 Gastritis, unspecified, without bleeding: Secondary | ICD-10-CM | POA: Insufficient documentation

## 2016-02-13 DIAGNOSIS — K4020 Bilateral inguinal hernia, without obstruction or gangrene, not specified as recurrent: Secondary | ICD-10-CM | POA: Diagnosis not present

## 2016-02-13 DIAGNOSIS — Z7982 Long term (current) use of aspirin: Secondary | ICD-10-CM | POA: Insufficient documentation

## 2016-02-13 DIAGNOSIS — K2980 Duodenitis without bleeding: Secondary | ICD-10-CM | POA: Insufficient documentation

## 2016-02-13 DIAGNOSIS — Z885 Allergy status to narcotic agent status: Secondary | ICD-10-CM | POA: Insufficient documentation

## 2016-02-13 DIAGNOSIS — Z8051 Family history of malignant neoplasm of kidney: Secondary | ICD-10-CM | POA: Insufficient documentation

## 2016-02-13 DIAGNOSIS — Z888 Allergy status to other drugs, medicaments and biological substances status: Secondary | ICD-10-CM | POA: Insufficient documentation

## 2016-02-13 DIAGNOSIS — K279 Peptic ulcer, site unspecified, unspecified as acute or chronic, without hemorrhage or perforation: Secondary | ICD-10-CM | POA: Insufficient documentation

## 2016-02-13 DIAGNOSIS — Z87891 Personal history of nicotine dependence: Secondary | ICD-10-CM | POA: Insufficient documentation

## 2016-02-13 DIAGNOSIS — Z8041 Family history of malignant neoplasm of ovary: Secondary | ICD-10-CM | POA: Insufficient documentation

## 2016-02-13 DIAGNOSIS — J449 Chronic obstructive pulmonary disease, unspecified: Secondary | ICD-10-CM | POA: Insufficient documentation

## 2016-02-13 DIAGNOSIS — M1990 Unspecified osteoarthritis, unspecified site: Secondary | ICD-10-CM | POA: Insufficient documentation

## 2016-02-13 DIAGNOSIS — Z8371 Family history of colonic polyps: Secondary | ICD-10-CM | POA: Insufficient documentation

## 2016-02-13 DIAGNOSIS — M5010 Cervical disc disorder with radiculopathy, unspecified cervical region: Secondary | ICD-10-CM | POA: Insufficient documentation

## 2016-02-13 DIAGNOSIS — Z8249 Family history of ischemic heart disease and other diseases of the circulatory system: Secondary | ICD-10-CM | POA: Insufficient documentation

## 2016-02-13 HISTORY — PX: INGUINAL HERNIA REPAIR: SHX194

## 2016-02-13 HISTORY — PX: INSERTION OF MESH: SHX5868

## 2016-02-13 SURGERY — REPAIR, HERNIA, INGUINAL, BILATERAL, LAPAROSCOPIC
Anesthesia: General

## 2016-02-13 MED ORDER — FENTANYL CITRATE (PF) 100 MCG/2ML IJ SOLN
INTRAMUSCULAR | Status: DC | PRN
  Administered 2016-02-13: 50 ug via INTRAVENOUS
  Administered 2016-02-13: 100 ug via INTRAVENOUS
  Administered 2016-02-13: 50 ug via INTRAVENOUS

## 2016-02-13 MED ORDER — FENTANYL CITRATE (PF) 100 MCG/2ML IJ SOLN: 25.0000 ug | INTRAMUSCULAR | Status: DC | PRN

## 2016-02-13 MED ORDER — IBUPROFEN 800 MG PO TABS: 800.0000 mg | ORAL_TABLET | Freq: Three times a day (TID) | ORAL | 0 refills | Status: DC | PRN

## 2016-02-13 MED ORDER — LIDOCAINE HCL (CARDIAC) 20 MG/ML IV SOLN
INTRAVENOUS | Status: DC | PRN
  Administered 2016-02-13: 30 mg via INTRAVENOUS

## 2016-02-13 MED ORDER — OXYCODONE HCL 5 MG/5ML PO SOLN: 5.0000 mg | Freq: Once | ORAL | Status: AC | PRN

## 2016-02-13 MED ORDER — ROCURONIUM BROMIDE 100 MG/10ML IV SOLN
INTRAVENOUS | Status: DC | PRN
  Administered 2016-02-13: 10 mg via INTRAVENOUS
  Administered 2016-02-13: 5 mg via INTRAVENOUS
  Administered 2016-02-13: 45 mg via INTRAVENOUS

## 2016-02-13 MED ORDER — CEFAZOLIN SODIUM-DEXTROSE 2-4 GM/100ML-% IV SOLN
INTRAVENOUS | Status: AC
  Administered 2016-02-13: 2 g via INTRAVENOUS
  Filled 2016-02-13: qty 100

## 2016-02-13 MED ORDER — OXYCODONE-ACETAMINOPHEN 5-325 MG PO TABS: 1.0000 | ORAL_TABLET | ORAL | 0 refills | Status: DC | PRN

## 2016-02-13 MED ORDER — ONDANSETRON HCL 4 MG/2ML IJ SOLN
INTRAMUSCULAR | Status: DC | PRN
  Administered 2016-02-13: 4 mg via INTRAVENOUS

## 2016-02-13 MED ORDER — MIDAZOLAM HCL 2 MG/2ML IJ SOLN
INTRAMUSCULAR | Status: DC | PRN
  Administered 2016-02-13: 2 mg via INTRAVENOUS

## 2016-02-13 MED ORDER — OXYCODONE HCL 5 MG PO TABS
ORAL_TABLET | ORAL | Status: AC
  Filled 2016-02-13: qty 1

## 2016-02-13 MED ORDER — CEFAZOLIN SODIUM-DEXTROSE 2-4 GM/100ML-% IV SOLN
2.0000 g | INTRAVENOUS | Status: AC
  Administered 2016-02-13: 2 g via INTRAVENOUS

## 2016-02-13 MED ORDER — LACTATED RINGERS IV SOLN
INTRAVENOUS | Status: DC
  Administered 2016-02-13: 12:00:00 via INTRAVENOUS

## 2016-02-13 MED ORDER — SUCCINYLCHOLINE CHLORIDE 20 MG/ML IJ SOLN
INTRAMUSCULAR | Status: DC | PRN
  Administered 2016-02-13: 100 mg via INTRAVENOUS

## 2016-02-13 MED ORDER — CHLORHEXIDINE GLUCONATE CLOTH 2 % EX PADS: 6.0000 | MEDICATED_PAD | Freq: Once | CUTANEOUS | Status: DC

## 2016-02-13 MED ORDER — OXYCODONE HCL 5 MG PO TABS
5.0000 mg | ORAL_TABLET | Freq: Once | ORAL | Status: AC | PRN
  Administered 2016-02-13: 5 mg via ORAL

## 2016-02-13 MED ORDER — EPHEDRINE SULFATE 50 MG/ML IJ SOLN
INTRAMUSCULAR | Status: DC | PRN
  Administered 2016-02-13 (×2): 10 mg via INTRAVENOUS
  Administered 2016-02-13: 5 mg via INTRAVENOUS

## 2016-02-13 MED ORDER — KETOROLAC TROMETHAMINE 30 MG/ML IJ SOLN
30.0000 mg | Freq: Once | INTRAMUSCULAR | Status: AC
  Administered 2016-02-13: 30 mg via INTRAVENOUS

## 2016-02-13 MED ORDER — PROPOFOL 10 MG/ML IV BOLUS
INTRAVENOUS | Status: DC | PRN
  Administered 2016-02-13: 170 mg via INTRAVENOUS

## 2016-02-13 MED ORDER — DEXAMETHASONE SODIUM PHOSPHATE 10 MG/ML IJ SOLN
INTRAMUSCULAR | Status: DC | PRN
  Administered 2016-02-13: 10 mg via INTRAVENOUS

## 2016-02-13 MED ORDER — BUPIVACAINE HCL (PF) 0.25 % IJ SOLN
INTRAMUSCULAR | Status: DC | PRN
  Administered 2016-02-13: 30 mL

## 2016-02-13 MED ORDER — KETOROLAC TROMETHAMINE 30 MG/ML IJ SOLN
INTRAMUSCULAR | Status: AC
  Administered 2016-02-13: 30 mg via INTRAVENOUS
  Filled 2016-02-13: qty 1

## 2016-02-13 MED ORDER — GLYCOPYRROLATE 0.2 MG/ML IJ SOLN
INTRAMUSCULAR | Status: DC | PRN
  Administered 2016-02-13: 0.2 mg via INTRAVENOUS

## 2016-02-13 MED ORDER — BUPIVACAINE HCL (PF) 0.25 % IJ SOLN
INTRAMUSCULAR | Status: AC
  Filled 2016-02-13: qty 30

## 2016-02-13 SURGICAL SUPPLY — 35 items
CANISTER SUCT 1200ML W/VALVE (MISCELLANEOUS) ×4 IMPLANT
CATH TRAY 16F METER LATEX (MISCELLANEOUS) ×4 IMPLANT
CHLORAPREP W/TINT 26ML (MISCELLANEOUS) ×4 IMPLANT
DEFOGGER SCOPE WARMER CLEARIFY (MISCELLANEOUS) ×4 IMPLANT
DEVICE SECURE STRAP 25 ABSORB (INSTRUMENTS) ×4 IMPLANT
DISSECT BALLN SPACEMKR OVL PDB (BALLOONS) ×4
DISSECTOR BALLN SPCMKR OVL PDB (BALLOONS) ×2 IMPLANT
ELECT CAUTERY BLADE 6.4 (BLADE) ×4 IMPLANT
ELECT REM PT RETURN 9FT ADLT (ELECTROSURGICAL) ×4
ELECTRODE REM PT RTRN 9FT ADLT (ELECTROSURGICAL) ×2 IMPLANT
GLOVE PI ORTHOPRO 6.5 (GLOVE) ×8
GLOVE PI ORTHOPRO STRL 6.5 (GLOVE) ×8 IMPLANT
GOWN STRL REUS W/ TWL LRG LVL3 (GOWN DISPOSABLE) ×4 IMPLANT
GOWN STRL REUS W/TWL LRG LVL3 (GOWN DISPOSABLE) ×4
IRRIGATION STRYKERFLOW (MISCELLANEOUS) ×2 IMPLANT
IRRIGATOR STRYKERFLOW (MISCELLANEOUS) ×4
IV NS 1000ML (IV SOLUTION) ×2
IV NS 1000ML BAXH (IV SOLUTION) ×2 IMPLANT
KIT RM TURNOVER STRD PROC AR (KITS) ×4 IMPLANT
LABEL OR SOLS (LABEL) ×4 IMPLANT
LIQUID BAND (GAUZE/BANDAGES/DRESSINGS) ×4 IMPLANT
MESH PARIETEX 6X4IN LEFT (Mesh General) ×4 IMPLANT
MESH PARIETEX 6X4IN RIGHT (Mesh General) ×4 IMPLANT
NEEDLE HYPO 25X1 1.5 SAFETY (NEEDLE) ×4 IMPLANT
NS IRRIG 500ML POUR BTL (IV SOLUTION) ×4 IMPLANT
PACK LAP CHOLECYSTECTOMY (MISCELLANEOUS) ×4 IMPLANT
PENCIL ELECTRO HAND CTR (MISCELLANEOUS) ×4 IMPLANT
SLEEVE ENDOPATH XCEL 5M (ENDOMECHANICALS) ×4 IMPLANT
SUT MNCRL 4-0 (SUTURE) ×2
SUT MNCRL 4-0 27XMFL (SUTURE) ×2
SUT VIC AB 0 CT2 27 (SUTURE) ×4 IMPLANT
SUTURE MNCRL 4-0 27XMF (SUTURE) ×2 IMPLANT
TROCAR BALLN 10M OMST10SB SPAC (TROCAR) ×4 IMPLANT
TROCAR XCEL NON-BLD 5MMX100MML (ENDOMECHANICALS) ×4 IMPLANT
TUBING INSUFFLATOR HI FLOW (MISCELLANEOUS) ×4 IMPLANT

## 2016-02-13 NOTE — Telephone Encounter (Signed)
Cardiac Clearance obtained at this time from Dr.Paraschos and will be scanned under media at this time.

## 2016-02-13 NOTE — Discharge Instructions (Signed)

## 2016-02-13 NOTE — Transfer of Care (Signed)
Immediate Anesthesia Transfer of Care Note  Patient: Bradley Barker  Procedure(s) Performed: Procedure(s): LAPAROSCOPIC BILATERAL INGUINAL HERNIA REPAIR (Bilateral) INSERTION OF MESH (N/A)  Patient Location: PACU  Anesthesia Type:General  Level of Consciousness: awake, alert  and oriented  Airway & Oxygen Therapy: Patient Spontanous Breathing and Patient connected to face mask oxygen  Post-op Assessment: Report given to RN and Post -op Vital signs reviewed and stable  Post vital signs: Reviewed and stable  Last Vitals:  Vitals:   02/13/16 1116 02/13/16 1436  BP: 126/77 (!) 139/100  Pulse: 60 68  Resp: 18 17  Temp: 36.6 C 36.3 C    Last Pain:  Vitals:   02/13/16 1116  TempSrc: Oral  PainSc: 3          Complications: No apparent anesthesia complications

## 2016-02-13 NOTE — Anesthesia Procedure Notes (Signed)
Procedure Name: Intubation Date/Time: 02/13/2016 12:41 PM Performed by: Jonna Clark Pre-anesthesia Checklist: Patient identified, Patient being monitored, Timeout performed, Emergency Drugs available and Suction available Patient Re-evaluated:Patient Re-evaluated prior to inductionOxygen Delivery Method: Circle system utilized Preoxygenation: Pre-oxygenation with 100% oxygen Intubation Type: IV induction Ventilation: Mask ventilation without difficulty Laryngoscope Size: Miller and 2 Grade View: Grade I Tube type: Oral Tube size: 7.5 mm Number of attempts: 1 Placement Confirmation: ETT inserted through vocal cords under direct vision,  positive ETCO2 and breath sounds checked- equal and bilateral Secured at: 21 cm Tube secured with: Tape Dental Injury: Teeth and Oropharynx as per pre-operative assessment

## 2016-02-13 NOTE — Anesthesia Preprocedure Evaluation (Signed)
Anesthesia Evaluation  Patient identified by MRN, date of birth, ID band Patient awake    Reviewed: Allergy & Precautions, H&P , NPO status , Patient's Chart, lab work & pertinent test results  History of Anesthesia Complications (+) PROLONGED EMERGENCE and history of anesthetic complications  Airway Mallampati: III  TM Distance: <3 FB Neck ROM: limited    Dental no notable dental hx. (+) Poor Dentition, Chipped, Caps   Pulmonary neg shortness of breath, COPD, former smoker,    Pulmonary exam normal breath sounds clear to auscultation       Cardiovascular Exercise Tolerance: Good (-) angina(-) Past MI and (-) DOE Normal cardiovascular exam+ dysrhythmias  Rhythm:regular Rate:Normal     Neuro/Psych PSYCHIATRIC DISORDERS Anxiety  Neuromuscular disease    GI/Hepatic Neg liver ROS, PUD, GERD  ,  Endo/Other  negative endocrine ROS  Renal/GU      Musculoskeletal  (+) Arthritis ,   Abdominal   Peds  Hematology negative hematology ROS (+)   Anesthesia Other Findings Patient has cardiac clearance for this procedure.   Past Medical History: No date: Anxiety No date: Arthritis No date: Complication of anesthesia     Comment: HARD TO WAKE UP AFTER KNEE SURGERY (2005) No date: Dysrhythmia     Comment: H/O PALPITATIONS-ON PROPRANOLOL AND HAS               CONTROLLED PALPITATIONS No date: GERD (gastroesophageal reflux disease)  Past Surgical History: No date: CARPAL TUNNEL RELEASE No date: HERNIA REPAIR No date: KNEE ARTHROSCOPY Bilateral No date: TIBIA FRACTURE SURGERY Left  BMI    Body Mass Index:  25.55 kg/m      Reproductive/Obstetrics negative OB ROS                             Anesthesia Physical Anesthesia Plan  ASA: III  Anesthesia Plan: General ETT   Post-op Pain Management:    Induction:   Airway Management Planned:   Additional Equipment:   Intra-op Plan:    Post-operative Plan:   Informed Consent: I have reviewed the patients History and Physical, chart, labs and discussed the procedure including the risks, benefits and alternatives for the proposed anesthesia with the patient or authorized representative who has indicated his/her understanding and acceptance.     Plan Discussed with: Anesthesiologist, CRNA and Surgeon  Anesthesia Plan Comments:         Anesthesia Quick Evaluation

## 2016-02-13 NOTE — Op Note (Signed)
LaparoscopicBilateral Inguinal Hernia Repair  RAS SWINDELL  02/13/2016  Pre-operative Diagnosis: Bilateral Inguinal Hernia  Post-operative Diagnosis: Bilateral  Inguinal hernia  Procedure: Laparoscopic preperitoneal repair of bilateral inguinal hernias   Surgeon: Susa Griffins, MD  EBL: 15cc  Anesthesia: Gen. with endotracheal tube  Assistant: none  Procedure Details  The patient was seen again in the Holding Room. The benefits, complications, treatment options, and expected outcomes were discussed with the patient. The risks of bleeding, infection, recurrence of symptoms, failure to resolve symptoms, recurrence of hernia, ischemic orchitis, chronic pain syndrome or neuroma, were discussed again. The likelihood of improving the patient's symptoms with return to their baseline status is good.  The patient and/or family concurred with the proposed plan, giving informed consent.  The patient was taken to the Operating Room, identified as Bradley Barker and the procedure verified as Laparoscopic Inguinal Hernia Repair. Laterality confirmed.  A Time Out was held and the above information confirmed.  Prior to the induction of general anesthesia, antibiotic prophylaxis was administered. VTE prophylaxis was in place. General endotracheal anesthesia was then administered and tolerated well. A Foley catheter was placed by the nursing staff. After the induction, the abdomen was prepped with Chloraprep and draped in the sterile fashion. The patient was positioned in the supine position.  Local anesthetic  was injected into the skin near the umbilicus and an incision made. An incision was made and dissection down to the rectus fascia was performed. The fascia was incised and the muscle retracted laterally. The Covidien dissecting balloon was placed followed by the structural balloon. The preperitoneal space was insufflated and under direct visiualization two 1mm ports were placed laterally on both  sides.  Dissection was performed to delineate Cooper's ligament and the lateral extent of dissection was determined on each side. The nerve on the lateral abdominal wall was identified and kept in view at all times. The cord was skeletonized of the indirect sac and cord lipoma which was retracted cephalad on each side.   Once this was complete, a right sided Parietex mesh was placed into the preperitoneal space . They were held in place with secure strap tacking device medially along the pubic tubericle avoiding the area of the nerve. Then the left sided mesh was placed inside the space and likewise secured in place medially overlapping the right sided mesh to the pubic tubercle. Once assuring that the hernias were completely repaired and adequately covered, the preperitoneal space was desufflated under direct vision. There was no sign of peritoneal rent and no sign of bowel intrusion towards the mesh.  Once assuring that hemostasis was adequate the ports were removed and a figure-of-eight 0 Vicryl suture was placed at the fascial edges. 4-0 subcuticular Monocryl was used at all skin edges. Sterile glue was placed as a skin dressing.    Instruments and sponge counts were correct at the end of the case. Patient tolerated the procedure well. There were no complications.     Findings: Indirect hernias bilaterally that were taken down, two paritex meshes placed with tacking medially

## 2016-02-13 NOTE — H&P (View-Only) (Signed)
Subjective:     Patient ID: Bradley Barker, male   DOB: [birth year], 60 y.o.   MRN: [id number]  HPI  60 year old man who has been noticing some pain in his back going around to his groin for more than 1 week. Patient states that he did ride in a car down the [place] for 10 hours and did have some back pain as well as left side that he noticed approximately 2 weeks ago. Patient states that since that time he's had pain down into his left groin as well. Patient was seen by his primary care physician who noted that he had a left inguinal hernia and he was referred here. Patient states that this pain has continued is not improved. Patient denies any nausea vomiting is having normal bowel movements he's eating and drinking well he has not had any pain or burning with urination he has also not noticed any difficulty starting or stopping. Patient has had no fevers or chills. Patient has not noticed that there is discrete bulge there just the pain in the area.  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 Kidney cancer Mother   . Heart disease Father   . Heart attack Father   . Ovarian cancer Sister   . Diabetes Brother    Social History   Social History  . Marital status: Married    Spouse name: N/A  . Number of children: N/A  . Years of education: N/A   Social History Main Topics  . Smoking status: Former Smoker    Types: Cigarettes    Quit date: 01/23/2011  . Smokeless tobacco: Never  Used  . Alcohol use Yes     Comment: 6 beers weekly  . Drug use: No  . Sexual activity: Not Asked   Other Topics Concern  . None   Social History Narrative  . None    Current Outpatient Prescriptions:  .  amitriptyline (ELAVIL) 25 MG tablet, Take by mouth., Disp: , Rfl:  .  aspirin EC 81 MG tablet, Take by mouth., Disp: , Rfl:  .  doxycycline (VIBRA-TABS) 100 MG tablet, Take by mouth., Disp: , Rfl:  .  fexofenadine (ALLEGRA) 180 MG tablet, Take by mouth., Disp: , Rfl:  .  guaiFENesin-codeine (ROBITUSSIN AC) 100-10 MG/5ML syrup, Take by mouth., Disp: , Rfl:  .  Omega-3 1000 MG CAPS, Take by mouth., Disp: , Rfl:  .  pregabalin (LYRICA) 150 MG capsule, Take by mouth., Disp: , Rfl:  .  propranolol (INDERAL) 40 MG tablet, Take by mouth., Disp: , Rfl:  Allergies  Allergen Reactions  . Cyclobenzaprine Other (See Comments)    Lip swelling  [Onset: 06/28/2012]  . Omeprazole Other (See Comments)  . Tramadol Swelling     [Onset: 06/21/2012]    Review of Systems  Constitutional: Negative for activity change, chills, fatigue and fever.  HENT: Negative for congestion and sore throat.   Respiratory: Negative for cough, choking, chest tightness, shortness of  breath and stridor.   Cardiovascular: Negative for chest pain, palpitations and leg swelling.  Gastrointestinal: Negative for abdominal pain, constipation, nausea and vomiting.  Genitourinary: Positive for flank pain. Negative for dysuria and hematuria.  Musculoskeletal: Positive for back pain.  Skin: Negative for color change, pallor and rash.  Neurological: Negative for dizziness and weakness.  Hematological: Negative for adenopathy. Does not bruise/bleed easily.  Psychiatric/Behavioral: Negative for agitation. The patient is not nervous/anxious.   All other systems reviewed and are negative.      Vitals:   01/23/16 1307  BP: (!) 145/99  Pulse: 61  Temp: 98.1 F (36.7 C)    Objective:   Physical Exam  Constitutional: He  is oriented to person, place, and time. He appears well-developed and well-nourished. No distress.  HENT:  Head: Normocephalic and atraumatic.  Right Ear: External ear normal.  Left Ear: External ear normal.  Nose: Nose normal.  Mouth/Throat: Oropharynx is clear and moist. No oropharyngeal exudate.  Eyes: Conjunctivae and EOM are normal. Pupils are equal, round, and reactive to light. No scleral icterus.  Neck: Normal range of motion. Neck supple. No tracheal deviation present.  Cardiovascular: Normal rate, regular rhythm, normal heart sounds and intact distal pulses.  Exam reveals no gallop and no friction rub.   No murmur heard. Pulmonary/Chest: Effort normal and breath sounds normal. No respiratory distress. He has no wheezes. He has no rales.  Abdominal: Soft. Bowel sounds are normal. He exhibits no distension. There is no tenderness. There is no rebound.  Bilateral mild CVA tenderness  Genitourinary:  Genitourinary Comments: BIlateral groin: with weakness in abdominal wall with strain and cough measuring about 2cm on both sides, tender on left side and around pubic tubercle and inguinal ligament  Musculoskeletal: Normal range of motion. He exhibits no edema, tenderness or deformity.  Neurological: He is alert and oriented to person, place, and time.  Skin: Skin is warm and dry. No rash noted. No erythema. No pallor.  Psychiatric: He has a normal mood and affect. His behavior is normal. Judgment and thought content normal.  Vitals reviewed.      CBC Latest Ref Rng & Units 01/23/2016 03/30/2013  WBC 3.8 - 10.6 K/uL 7.7 6.7  Hemoglobin 13.0 - 18.0 g/dL 40.9 81.1  Hematocrit 91.4 - 52.0 % 45.7 45.0  Platelets 150 - 440 K/uL 220 176   CMP Latest Ref Rng & Units 01/23/2016 03/30/2013  Glucose 65 - 99 mg/dL 99 91  BUN 6 - 20 mg/dL 18 16  Creatinine 7.82 - 1.24 mg/dL 9.56 2.13  Sodium 086 - 145 mmol/L 138 138  Potassium 3.5 - 5.1 mmol/L 3.8 4.2  Chloride 101 - 111 mmol/L 102 106  CO2  22 - 32 mmol/L 29 28  Calcium 8.9 - 10.3 mg/dL 57.8 46.9  Total Protein 6.5 - 8.1 g/dL 7.6 -  Total Bilirubin 0.3 - 1.2 mg/dL 0.6 -  Alkaline Phos 38 - 126 U/L 82 -  AST 15 - 41 U/L 16 -  ALT 17 - 63 U/L 22 -   U/A: neg Assessment:     60 year old male with bilateral inguinal hernias.    Plan:     I reviewed the patient's past medical history which includes some duodenitis and duodenal ulcer as well as some cervical back pain. Patient has not ever had any lower back pain previously. Or any history kidney stones. I personally reviewed the patient's laboratory values which include a normal white blood cell count and normal electrolytes as  well as a normal UA without any blood or bacteria seen. Because of these normal findings on his laboratory values to do believe that he strained his back couple weeks back that he does not currently have a UTI or kidney stone since there is no blood in the urine. He does however have bilateral inguinal hernias the right side which is recurrent. Patient is unsure whether or not they put mesh on that side back in the 90s and there is no record of this in our system.  I discussed possibility of incarceration, strangulation, enlargement in size over time, and the risk of emergency surgery in the face of strangulation.  Also discussed the risk of surgery including recurrence which can be up to 30% in the case of complex hernias, use of prosthetic materials (mesh) and the increased risk of infxn, post-op infxn and the possible need for re-operation and removal of mesh if used, possibility of post-op SBO or ileus, and the risks of general anesthetic including MI, CVA, sudden death or even reaction to anesthetic medications. The patient the risks, any and all questions were answered to the patient's satisfaction.  I will set him up for Laparoscopic Bilateral Inguinal hernia repair with mesh in late October/early November since his wife is out of town until that time.

## 2016-02-13 NOTE — Interval H&P Note (Signed)
History and Physical Interval Note:  02/13/2016 12:27 PM  Bradley Barker  has presented today for surgery, with the diagnosis of bilateral inguinal hernia  The various methods of treatment have been discussed with the patient and family. After consideration of risks, benefits and other options for treatment, the patient has consented to  Procedure(s): [place] (Bilateral) INSERTION OF MESH (N/A) as a surgical intervention .  The patient's history has been reviewed, patient examined, no change in status, stable for surgery.  I have reviewed the patient's chart and labs.  Questions were answered to the patient's satisfaction.     [name]

## 2016-02-14 ENCOUNTER — Encounter: Payer: Self-pay | Admitting: Surgery

## 2016-02-14 NOTE — Anesthesia Postprocedure Evaluation (Signed)
Anesthesia Post Note  Patient: Bradley Barker  Procedure(s) Performed: Procedure(s) (LRB): LAPAROSCOPIC BILATERAL INGUINAL HERNIA REPAIR (Bilateral) INSERTION OF MESH (N/A)  Patient location during evaluation: PACU Anesthesia Type: General Level of consciousness: awake and alert Pain management: pain level controlled Vital Signs Assessment: post-procedure vital signs reviewed and stable Respiratory status: spontaneous breathing, nonlabored ventilation, respiratory function stable and patient connected to nasal cannula oxygen Cardiovascular status: blood pressure returned to baseline and stable Postop Assessment: no signs of nausea or vomiting Anesthetic complications: no    Last Vitals:  Vitals:   02/13/16 1528 02/13/16 1608  BP:  107/70  Pulse: 64 60  Resp:  16  Temp: (!) 35.9 C     Last Pain:  Vitals:   02/13/16 1608  TempSrc:   PainSc: 6                  Precious Haws Piscitello

## 2016-02-17 ENCOUNTER — Other Ambulatory Visit: Payer: Self-pay

## 2016-02-17 ENCOUNTER — Telehealth: Payer: Self-pay

## 2016-02-17 NOTE — Telephone Encounter (Signed)
Patient called in with concerns of bruising and states that his testicle area is completely black and blue. Denies pain. Denies difficulty with urination.  I explained to patient that this was completely normal following this type of surgery and to help, I would recommend ice 4-5 times daily for 15-20 minutes each time as well as elevation while he is at home with a towel. Patient verbalizes understanding and will call back with any further questions or concerns.

## 2016-02-19 ENCOUNTER — Telehealth: Payer: Self-pay | Admitting: *Deleted

## 2016-02-19 NOTE — Telephone Encounter (Signed)
Received referral for initial lung cancer screening scan. Contacted patient and obtained smoking history,(former, quit 2012, 35 pack year) as well as answering questions related to screening process. Patient denies signs of lung cancer such as weight loss or hemoptysis. Patient denies comorbidity that would prevent curative treatment if lung cancer were found. Patient is tentatively scheduled for shared decision making visit and CT scan on 03/03/16 at 1:30pm, pending insurance approval from business office.

## 2016-02-21 ENCOUNTER — Telehealth: Payer: Self-pay

## 2016-02-21 NOTE — Telephone Encounter (Signed)
Disability form was filled out and faxed. 

## 2016-02-25 ENCOUNTER — Ambulatory Visit: Admit: 2016-02-25 | Payer: 59 | Admitting: Surgery

## 2016-02-25 SURGERY — REPAIR, HERNIA, INGUINAL, BILATERAL, LAPAROSCOPIC
Anesthesia: General | Laterality: Bilateral

## 2016-02-26 ENCOUNTER — Ambulatory Visit (INDEPENDENT_AMBULATORY_CARE_PROVIDER_SITE_OTHER): Payer: 59 | Admitting: Surgery

## 2016-02-26 ENCOUNTER — Encounter: Payer: Self-pay | Admitting: Surgery

## 2016-02-26 VITALS — BP 149/89 | HR 61 | Temp 98.6°F | Ht 74.0 in | Wt 197.0 lb

## 2016-02-26 DIAGNOSIS — K4001 Bilateral inguinal hernia, with obstruction, without gangrene, recurrent: Secondary | ICD-10-CM

## 2016-02-26 MED ORDER — OXYCODONE-ACETAMINOPHEN 5-325 MG PO TABS: 1.0000 | ORAL_TABLET | Freq: Four times a day (QID) | ORAL | 0 refills | Status: DC | PRN

## 2016-02-26 MED ORDER — IBUPROFEN 800 MG PO TABS: 800.0000 mg | ORAL_TABLET | Freq: Three times a day (TID) | ORAL | 0 refills | Status: DC | PRN

## 2016-02-26 NOTE — Progress Notes (Signed)
Outpatient Surgical Follow Up  02/26/2016  Bradley Barker is an 60 y.o. male seen for the diagnosis of Bilateral recurrent inguinal hernia with obstruction and without gangrene [K40.01].  HPI: He had a laparoscopic bilateral inguinal hernia repair on 10/12. Overall he is doing well and denies N/V, D/C, fevers or malaise. Reports compliance with post-op instructions.  Has not lifted anything over 5 pounds, has been taking it easy without any strenuous activities, he has been elevating the scrotum and has been using ice to the area to help. Patient states he did have some swelling in the scrotum initially but that resolved over about a 3 day time frame. He also endorses pain along the lower portion the pubic area more so on the right side than the left. He did describes this as a dull aching pain that is relieved by ice and ibuprofen. Patient denies any sharp burning or electrical type pains. He also discussed denies any fever chills nausea vomiting he has had some constipation but states that that was better since decreasing the Percocet.  Past Medical History:  Diagnosis Date  . Anxiety   . Arthritis   . Complication of anesthesia    HARD TO WAKE UP AFTER KNEE SURGERY (2005)  . Dysrhythmia    H/O PALPITATIONS-ON PROPRANOLOL AND HAS CONTROLLED PALPITATIONS  . GERD (gastroesophageal reflux disease)     Past Surgical History:  Procedure Laterality Date  . CARPAL TUNNEL RELEASE    . HERNIA REPAIR    . INGUINAL HERNIA REPAIR Bilateral 02/13/2016   Procedure: LAPAROSCOPIC BILATERAL INGUINAL HERNIA REPAIR;  Surgeon: Hubbard Robinson, MD;  Location: [facility] ORS;  Service: General;  Laterality: Bilateral;  . INSERTION OF MESH N/A 02/13/2016   Procedure: INSERTION OF MESH;  Surgeon: Hubbard Robinson, MD;  Location: [facility] ORS;  Service: General;  Laterality: N/A;  . KNEE ARTHROSCOPY Bilateral   . TIBIA FRACTURE SURGERY Left     Family History  Problem Relation Age of Onset  . Kidney cancer Mother    . Heart disease Father   . Heart attack Father   . Ovarian cancer Sister   . Diabetes Brother     Social History:  reports that he quit smoking about 5 years ago. His smoking use included Cigarettes. He has a 35.00 pack-year smoking history. He has never used smokeless tobacco. He reports that he drinks alcohol. He reports that he does not use drugs.  Allergies:  Allergies  Allergen Reactions  . Cyclobenzaprine Other (See Comments)    Lip swelling  [Onset: 06/28/2012]  . Other      PT STATES CERTAIN PPI'S CAUSE HEADACHES-PT TAKING OMEPRAZOLE AND IS TOLERATING WELL (02-11-16)  . Tramadol Swelling     [Onset: 06/21/2012]    Medications reviewed.  Physical Exam:  BP (!) 149/89 (BP Location: Right Arm, Patient Position: Sitting)   Pulse 61   Temp 98.6 F (37 C) (Oral)   Ht 6\' 2"  (1.88 m)   Wt 197 lb (89.4 kg)   BMI 25.29 kg/m   Gen: patient resting comfortably in clinic, no cardiovascular or respiratory distress Abd/GI: Incision sites clean dry intact with some sterile glue still in place no erythema or drainage, no swelling but some tenderness in the right pubic tubercle  Assessment/Plan: Bradley Barker is an 60 y.o. male seen for the diagnosis of Bilateral recurrent inguinal hernia with obstruction and without gangrene [K40.01]. Progressing as expected. I believe he has no pain but rather has pain from the suture  tacks to the pubic tubercle. I instructed him to continue the ibuprofen and have given him another prescription for this and to continue the icing. I did instruct him that the Taxotere use world is all but can take up to about 6 weeks for this. Likewise the pain mesh is incorporating in the body and some inflammation may be in that area causing some of the pain for another 6 weeks as well. I will have him return to clinic in 3 weeks to ensure that he is improving and in the meantime continue using the ibuprofen and ice and given him a refill prescription for the Percocet if he  needs it for stronger pain as well.   Tramon Crescenzo L. Leiliana Foody MD General Surgeon  02/26/2016,6:25 PM

## 2016-02-26 NOTE — Patient Instructions (Signed)
Please fill your prescriptions if needed.   Call our office with any questions or concerns.  Please see follow-up appointment information below.  Please see work note provided. Have your employer call with any questions that they may have in regards to your work restrictions. We will see you back in 3 weeks and discuss any further work restrictions.

## 2016-02-27 ENCOUNTER — Telehealth: Payer: Self-pay | Admitting: Surgery

## 2016-02-27 NOTE — Telephone Encounter (Signed)
Does the glue dissolve or peel off? Do the stitches dissolve? Please call. f

## 2016-02-27 NOTE — Telephone Encounter (Signed)
Returned phone call to patient at this time. Home phone is busy. Cell phone- no answer. Left voicemail for return phone call.  Sutures are dissolvable type. This typically takes 6-8 weeks. Also glue will fall off of incision site when it is ready to do so.

## 2016-02-27 NOTE — Telephone Encounter (Signed)
Returned phone call to patient once again at this time. Explained all information below. He verbalized understanding of this.

## 2016-03-02 ENCOUNTER — Other Ambulatory Visit: Payer: Self-pay | Admitting: *Deleted

## 2016-03-02 DIAGNOSIS — Z87891 Personal history of nicotine dependence: Secondary | ICD-10-CM

## 2016-03-03 ENCOUNTER — Ambulatory Visit
Admission: RE | Admit: 2016-03-03 | Discharge: 2016-03-03 | Disposition: A | Discharge status: Home / Self Care | Payer: 59 | Source: Ambulatory Visit | Attending: Oncology | Admitting: Oncology

## 2016-03-03 ENCOUNTER — Inpatient Hospital Stay: Payer: 59 | Attending: Oncology | Admitting: Oncology

## 2016-03-03 ENCOUNTER — Encounter: Payer: Self-pay | Admitting: Surgery

## 2016-03-03 DIAGNOSIS — Z122 Encounter for screening for malignant neoplasm of respiratory organs: Secondary | ICD-10-CM | POA: Insufficient documentation

## 2016-03-03 DIAGNOSIS — R918 Other nonspecific abnormal finding of lung field: Secondary | ICD-10-CM | POA: Diagnosis not present

## 2016-03-03 DIAGNOSIS — J439 Emphysema, unspecified: Secondary | ICD-10-CM | POA: Diagnosis not present

## 2016-03-03 DIAGNOSIS — Z87891 Personal history of nicotine dependence: Secondary | ICD-10-CM | POA: Insufficient documentation

## 2016-03-03 NOTE — Progress Notes (Signed)
In accordance with CMS guidelines, patient has met eligibility criteria including age, absence of signs or symptoms of lung cancer.  Social History  Substance Use Topics  . Smoking status: Former Smoker    Packs/day: 1.00    Years: 35.00    Types: Cigarettes    Quit date: 01/23/2011  . Smokeless tobacco: Never Used  . Alcohol use Yes     Comment: 6 beers weekly     A shared decision-making session was conducted prior to the performance of CT scan. This includes one or more decision aids, includes benefits and harms of screening, follow-up diagnostic testing, over-diagnosis, false positive rate, and total radiation exposure.  Counseling on the importance of adherence to annual lung cancer LDCT screening, impact of co-morbidities, and ability or willingness to undergo diagnosis and treatment is imperative for compliance of the program.  Counseling on the importance of continued smoking cessation for former smokers; the importance of smoking cessation for current smokers, and information about tobacco cessation interventions have been given to patient including [facility] and [facility] programs.  Written order for lung cancer screening with LDCT has been given to the patient and any and all questions have been answered to the best of my abilities.   Yearly follow up will be coordinated by Burgess Estelle, Thoracic Navigator.

## 2016-03-05 ENCOUNTER — Telehealth: Payer: Self-pay | Admitting: *Deleted

## 2016-03-05 NOTE — Telephone Encounter (Signed)
Voicemail left with attempt to notify patient of LDCT lung cancer screening results with recommendation for 12 month follow up imaging. Also, upon return call, will be notified of incidental finding noted below. This note will be forwarded to PCP via Epic.   IMPRESSION: 1. Lung-RADS Category 2, benign appearance or behavior. Continue annual screening with low-dose chest CT without contrast in 12 months 2. Emphysema

## 2016-03-20 ENCOUNTER — Encounter: Payer: Self-pay | Admitting: Surgery

## 2016-03-20 ENCOUNTER — Ambulatory Visit (INDEPENDENT_AMBULATORY_CARE_PROVIDER_SITE_OTHER): Payer: 59 | Admitting: Surgery

## 2016-03-20 ENCOUNTER — Other Ambulatory Visit: Payer: Self-pay

## 2016-03-20 VITALS — BP 139/93 | HR 66 | Temp 98.5°F | Ht 75.0 in | Wt 202.0 lb

## 2016-03-20 DIAGNOSIS — K4001 Bilateral inguinal hernia, with obstruction, without gangrene, recurrent: Secondary | ICD-10-CM

## 2016-03-20 NOTE — Patient Instructions (Signed)
Please call with any questions or concerns.

## 2016-03-20 NOTE — Progress Notes (Signed)
60 year old male who had a laparoscopic bilateral inguinal hernia repair on 10/12. The patient was seen initially in follow-up and had about 5 out of 10 pain to the pubic tubercle area likely from the tacks. The patient states that the pain is a soreness in a dull achy pain in the area sometimes it can be more if he has been up and moving around. Patient states that the pain now is much improved and is a level about a 1 out of the 10 range and if he has been up and moving around a lot it only goes up to about a 3 out of 10. Patient states that he can take some Tylenol and this takes the pain away. He does not describe any electrical hot poker or burning sensation with the pain just a little achy pain.  Vitals:   03/20/16 0930  BP: (!) 139/93  Pulse: 66  Temp: 98.5 F (36.9 C)   PE:  Gen: NAD Tenderness to the pubic tubercle more on the right than the left incisions well-healed  A/P: Patient doing much better from a pain standpoint. I believe he is having pain in the pubic tubercle from the tacks that were placed. I expect that this will continue to improve and that the tacks will dissolve in about week 12. He was instructed that he could start working out again. He is to cost with any questions or concerns

## 2017-02-23 ENCOUNTER — Telehealth: Payer: Self-pay | Admitting: *Deleted

## 2017-02-23 NOTE — Telephone Encounter (Signed)
Left message for patient to notify them that it is time to schedule annual low dose lung cancer screening CT scan. Instructed patient to call back to verify information prior to the scan being scheduled.  

## 2017-02-25 ENCOUNTER — Telehealth: Payer: Self-pay | Admitting: *Deleted

## 2017-02-25 DIAGNOSIS — Z122 Encounter for screening for malignant neoplasm of respiratory organs: Secondary | ICD-10-CM

## 2017-02-25 DIAGNOSIS — Z87891 Personal history of nicotine dependence: Secondary | ICD-10-CM

## 2017-02-25 NOTE — Telephone Encounter (Signed)
Notified patient that annual lung cancer screening low dose CT scan is due currently or will be in near future. Confirmed that patient is within the age range of 55-77, and asymptomatic, (no signs or symptoms of lung cancer). Patient denies illness that would prevent curative treatment for lung cancer if found. Verified smoking history, (former, quit 01/23/11, 35 pack year). The shared decision making visit was done 03/03/16. Patient is agreeable for CT scan being scheduled.

## 2017-03-01 DIAGNOSIS — Z23 Encounter for immunization: Secondary | ICD-10-CM | POA: Diagnosis not present

## 2017-03-03 ENCOUNTER — Ambulatory Visit
Admission: RE | Admit: 2017-03-03 | Discharge: 2017-03-03 | Disposition: A | Discharge status: Home / Self Care | Payer: 59 | Source: Ambulatory Visit | Attending: Nurse Practitioner | Admitting: Nurse Practitioner

## 2017-03-03 DIAGNOSIS — Z87891 Personal history of nicotine dependence: Secondary | ICD-10-CM | POA: Insufficient documentation

## 2017-03-03 DIAGNOSIS — J439 Emphysema, unspecified: Secondary | ICD-10-CM | POA: Diagnosis not present

## 2017-03-03 DIAGNOSIS — Z122 Encounter for screening for malignant neoplasm of respiratory organs: Secondary | ICD-10-CM | POA: Diagnosis not present

## 2017-03-08 ENCOUNTER — Encounter: Payer: Self-pay | Admitting: *Deleted

## 2017-03-22 DIAGNOSIS — D485 Neoplasm of uncertain behavior of skin: Secondary | ICD-10-CM | POA: Diagnosis not present

## 2017-03-22 DIAGNOSIS — L700 Acne vulgaris: Secondary | ICD-10-CM | POA: Diagnosis not present

## 2017-03-22 DIAGNOSIS — Z1283 Encounter for screening for malignant neoplasm of skin: Secondary | ICD-10-CM | POA: Diagnosis not present

## 2017-03-22 DIAGNOSIS — L720 Epidermal cyst: Secondary | ICD-10-CM | POA: Diagnosis not present

## 2017-03-22 DIAGNOSIS — I10 Essential (primary) hypertension: Secondary | ICD-10-CM | POA: Diagnosis not present

## 2017-03-29 DIAGNOSIS — Z0000 Encounter for general adult medical examination without abnormal findings: Secondary | ICD-10-CM | POA: Diagnosis not present

## 2017-08-09 DIAGNOSIS — M2391 Unspecified internal derangement of right knee: Secondary | ICD-10-CM | POA: Diagnosis not present

## 2017-08-09 DIAGNOSIS — G8929 Other chronic pain: Secondary | ICD-10-CM | POA: Diagnosis not present

## 2017-08-09 DIAGNOSIS — M25461 Effusion, right knee: Secondary | ICD-10-CM | POA: Diagnosis not present

## 2017-08-09 DIAGNOSIS — M25561 Pain in right knee: Secondary | ICD-10-CM | POA: Diagnosis not present

## 2017-12-09 DIAGNOSIS — M5442 Lumbago with sciatica, left side: Secondary | ICD-10-CM | POA: Diagnosis not present

## 2017-12-13 DIAGNOSIS — Z86010 Personal history of colonic polyps: Secondary | ICD-10-CM | POA: Diagnosis not present

## 2018-02-03 ENCOUNTER — Telehealth: Payer: Self-pay | Admitting: *Deleted

## 2018-02-03 NOTE — Telephone Encounter (Signed)
Attempted to contact patient r/t LDCT Screening follow up due at this time.  No answer received, message left for patient to call [phone] to schedule appointment.    

## 2018-02-07 ENCOUNTER — Telehealth: Payer: Self-pay | Admitting: *Deleted

## 2018-02-07 DIAGNOSIS — Z122 Encounter for screening for malignant neoplasm of respiratory organs: Secondary | ICD-10-CM

## 2018-02-07 NOTE — Telephone Encounter (Signed)
Patient has been notified that the annual lung cancer screening low dose CT scan is due currently or will be in the near future.  Confirmed that the patient is within the age range of 39-80, and asymptomatic, and currently exhibits no signs or symptoms of lung cancer.  Patient denies illness that would prevent curative treatment for lung cancer if found.  Verified smoking history,former smoker 35 pkyr history   The shared decision making visit was completed on 03-03-16.  Patient is agreeable for the CT scan to be scheduled.  Will call patient back with date and time of appointment.

## 2018-02-08 DIAGNOSIS — Z23 Encounter for immunization: Secondary | ICD-10-CM | POA: Diagnosis not present

## 2018-02-16 ENCOUNTER — Telehealth: Payer: Self-pay | Admitting: *Deleted

## 2018-02-16 NOTE — Telephone Encounter (Signed)
  Called pt to inform of appt for ldct screening at 4:30 pm here at [facility] on 03-08-18 Patient requested earlier appt, sent to scheduling.

## 2018-02-17 ENCOUNTER — Telehealth: Payer: Self-pay | Admitting: *Deleted

## 2018-02-17 NOTE — Telephone Encounter (Signed)
Called patient to inform him of his appt for ldct screening on 4:00 pm here at [facility] on 03-08-18, message left for patient, appt mailed.

## 2018-03-03 ENCOUNTER — Encounter: Payer: Self-pay | Admitting: *Deleted

## 2018-03-04 ENCOUNTER — Encounter: Payer: Self-pay | Admitting: *Deleted

## 2018-03-04 ENCOUNTER — Ambulatory Visit: Payer: 59 | Admitting: Certified Registered Nurse Anesthetist

## 2018-03-04 ENCOUNTER — Ambulatory Visit
Admission: RE | Admit: 2018-03-04 | Discharge: 2018-03-04 | Disposition: A | Discharge status: Home / Self Care | Payer: 59 | Source: Ambulatory Visit | Attending: Unknown Physician Specialty | Admitting: Unknown Physician Specialty

## 2018-03-04 ENCOUNTER — Encounter
Admission: RE | Disposition: A | Discharge status: Home / Self Care | Payer: Self-pay | Source: Ambulatory Visit | Attending: Unknown Physician Specialty

## 2018-03-04 DIAGNOSIS — K5730 Diverticulosis of large intestine without perforation or abscess without bleeding: Secondary | ICD-10-CM | POA: Diagnosis not present

## 2018-03-04 DIAGNOSIS — D128 Benign neoplasm of rectum: Secondary | ICD-10-CM | POA: Diagnosis not present

## 2018-03-04 DIAGNOSIS — F419 Anxiety disorder, unspecified: Secondary | ICD-10-CM | POA: Diagnosis not present

## 2018-03-04 DIAGNOSIS — Z7982 Long term (current) use of aspirin: Secondary | ICD-10-CM | POA: Insufficient documentation

## 2018-03-04 DIAGNOSIS — Z87891 Personal history of nicotine dependence: Secondary | ICD-10-CM | POA: Insufficient documentation

## 2018-03-04 DIAGNOSIS — M1990 Unspecified osteoarthritis, unspecified site: Secondary | ICD-10-CM | POA: Diagnosis not present

## 2018-03-04 DIAGNOSIS — Z86010 Personal history of colonic polyps: Secondary | ICD-10-CM | POA: Insufficient documentation

## 2018-03-04 DIAGNOSIS — K5790 Diverticulosis of intestine, part unspecified, without perforation or abscess without bleeding: Secondary | ICD-10-CM | POA: Diagnosis not present

## 2018-03-04 DIAGNOSIS — Z1211 Encounter for screening for malignant neoplasm of colon: Secondary | ICD-10-CM | POA: Insufficient documentation

## 2018-03-04 DIAGNOSIS — K621 Rectal polyp: Secondary | ICD-10-CM | POA: Insufficient documentation

## 2018-03-04 DIAGNOSIS — K219 Gastro-esophageal reflux disease without esophagitis: Secondary | ICD-10-CM | POA: Insufficient documentation

## 2018-03-04 DIAGNOSIS — K635 Polyp of colon: Secondary | ICD-10-CM | POA: Diagnosis not present

## 2018-03-04 DIAGNOSIS — K5791 Diverticulosis of intestine, part unspecified, without perforation or abscess with bleeding: Secondary | ICD-10-CM | POA: Diagnosis not present

## 2018-03-04 DIAGNOSIS — F329 Major depressive disorder, single episode, unspecified: Secondary | ICD-10-CM | POA: Diagnosis not present

## 2018-03-04 HISTORY — DX: Gastroduodenitis, unspecified, without bleeding: K29.90

## 2018-03-04 HISTORY — DX: Depression, unspecified: F32.A

## 2018-03-04 HISTORY — DX: Tachycardia, unspecified: R00.0

## 2018-03-04 HISTORY — PX: COLONOSCOPY WITH PROPOFOL: SHX5780

## 2018-03-04 HISTORY — DX: Family history of colon polyps, unspecified: Z83.719

## 2018-03-04 HISTORY — DX: Unspecified hemorrhoids: K64.9

## 2018-03-04 HISTORY — DX: Major depressive disorder, single episode, unspecified: F32.9

## 2018-03-04 HISTORY — DX: Allergic rhinitis, unspecified: J30.9

## 2018-03-04 HISTORY — DX: Palpitations: R00.2

## 2018-03-04 HISTORY — DX: Duodenal ulcer, unspecified as acute or chronic, without hemorrhage or perforation: K26.9

## 2018-03-04 HISTORY — DX: Fracture of orbit, unspecified, initial encounter for closed fracture: S02.85XA

## 2018-03-04 HISTORY — DX: Family history of colonic polyps: Z83.71

## 2018-03-04 SURGERY — COLONOSCOPY WITH PROPOFOL
Anesthesia: General

## 2018-03-04 MED ORDER — PROPOFOL 10 MG/ML IV BOLUS
INTRAVENOUS | Status: DC | PRN
  Administered 2018-03-04: 20 mg via INTRAVENOUS
  Administered 2018-03-04 (×2): 30 mg via INTRAVENOUS
  Administered 2018-03-04 (×2): 20 mg via INTRAVENOUS

## 2018-03-04 MED ORDER — SODIUM CHLORIDE 0.9 % IV SOLN: INTRAVENOUS | Status: DC

## 2018-03-04 MED ORDER — LIDOCAINE HCL (CARDIAC) PF 100 MG/5ML IV SOSY
PREFILLED_SYRINGE | INTRAVENOUS | Status: DC | PRN
  Administered 2018-03-04: 50 mg via INTRAVENOUS

## 2018-03-04 MED ORDER — PROPOFOL 500 MG/50ML IV EMUL
INTRAVENOUS | Status: DC | PRN
  Administered 2018-03-04: 85 ug/kg/min via INTRAVENOUS

## 2018-03-04 MED ORDER — PROPOFOL 500 MG/50ML IV EMUL
INTRAVENOUS | Status: AC
  Filled 2018-03-04: qty 50

## 2018-03-04 MED ORDER — PROPOFOL 10 MG/ML IV BOLUS
INTRAVENOUS | Status: AC
  Filled 2018-03-04: qty 20

## 2018-03-04 MED ORDER — SODIUM CHLORIDE 0.9 % IV SOLN
INTRAVENOUS | Status: DC
  Administered 2018-03-04: 1000 mL via INTRAVENOUS

## 2018-03-04 NOTE — Anesthesia Postprocedure Evaluation (Signed)
Anesthesia Post Note  Patient: Bradley Barker  Procedure(s) Performed: COLONOSCOPY WITH PROPOFOL (N/A )  Patient location during evaluation: Endoscopy Anesthesia Type: General Level of consciousness: awake and alert Pain management: pain level controlled Vital Signs Assessment: post-procedure vital signs reviewed and stable Respiratory status: spontaneous breathing and respiratory function stable Cardiovascular status: stable Anesthetic complications: no     Last Vitals:  Vitals:   03/04/18 1008 03/04/18 1050  BP: 132/86 113/75  Pulse: 61 64  Resp: 18 16  Temp: (!) 36 C (!) 36.1 C  SpO2: 99% 100%    Last Pain:  Vitals:   03/04/18 1050  TempSrc: Tympanic  PainSc:                  Laketra Bowdish K

## 2018-03-04 NOTE — H&P (Signed)
Primary Care Physician:  Juluis Pitch, MD Primary Gastroenterologist:  Dr. Vira Agar  Pre-Procedure History & Physical: HPI:  Bradley Barker is a 62 y.o. male is here for an colonoscopy.   Past Medical History:  Diagnosis Date  . Allergic rhinitis   . Anxiety   . Arthritis   . Complication of anesthesia    HARD TO WAKE UP AFTER KNEE SURGERY (2005)  . Depression   . Duodenal ulcer   . Dysrhythmia    H/O PALPITATIONS-ON PROPRANOLOL AND HAS CONTROLLED PALPITATIONS  . Family history of colonic polyps   . Gastritis and duodenitis   . GERD (gastroesophageal reflux disease)   . Hemorrhoids   . Orbit fracture, left   . Palpitations   . Tachycardia     Past Surgical History:  Procedure Laterality Date  . CARDIAC CATHETERIZATION    . CARPAL TUNNEL RELEASE    . FRACTURE SURGERY Left    2007  . HERNIA REPAIR    . INGUINAL HERNIA REPAIR Bilateral 02/13/2016   Procedure: LAPAROSCOPIC BILATERAL INGUINAL HERNIA REPAIR;  Surgeon: Hubbard Robinson, MD;  Location: [facility] ORS;  Service: General;  Laterality: Bilateral;  . INSERTION OF MESH N/A 02/13/2016   Procedure: INSERTION OF MESH;  Surgeon: Hubbard Robinson, MD;  Location: [facility] ORS;  Service: General;  Laterality: N/A;  . KNEE ARTHROSCOPY Bilateral   . TIBIA FRACTURE SURGERY Left   . VASECTOMY      Prior to Admission medications   Medication Sig Start Date End Date Taking? Authorizing Provider  amitriptyline (ELAVIL) 25 MG tablet Take 25 mg by mouth at bedtime.  04/23/15  Yes [provider]  aspirin EC 81 MG tablet Take 81 mg by mouth daily.    Yes [provider]  Omega-3 1000 MG CAPS Take by mouth.   Yes [provider]  omeprazole (PRILOSEC) 20 MG capsule Take 20 mg by mouth at bedtime.   Yes [provider]  propranolol (INDERAL) 40 MG tablet Take 40 mg by mouth 2 (two) times daily.  03/19/15  Yes [provider]  tadalafil (ADCIRCA/CIALIS) 20 MG tablet Take 20 mg by mouth daily  as needed for erectile dysfunction.   Yes [provider]  fexofenadine (ALLEGRA) 180 MG tablet Take 180 mg by mouth every morning.     [provider]  ibuprofen (ADVIL,MOTRIN) 800 MG tablet Take 800 mg by mouth every 8 (eight) hours as needed. 02/13/16   [provider]  pregabalin (LYRICA) 150 MG capsule Take 150 mg by mouth at bedtime.  12/16/15 12/15/16  [provider]  sildenafil (REVATIO) 20 MG tablet Take 1-5 tablets by mouth as needed. 03/03/16   [provider]    Allergies as of 12/17/2017 - Review Complete 03/20/2016  Allergen Reaction Noted  . Cyclobenzaprine Other (See Comments) 10/18/2013  . Other  02/11/2016  . Tramadol Swelling 10/18/2013    Family History  Problem Relation Age of Onset  . Kidney cancer Mother   . Heart disease Father   . Heart attack Father   . Ovarian cancer Sister   . Diabetes Brother     Social History   Socioeconomic History  . Marital status: Married    Spouse name: Not on file  . Number of children: Not on file  . Years of education: Not on file  . Highest education level: Not on file  Occupational History  . Not on file  Social Needs  . Financial resource strain: Not  on file  . Food insecurity:    Worry: Not on file    Inability: Not on file  . Transportation needs:    Medical: Not on file    Non-medical: Not on file  Tobacco Use  . Smoking status: Former Smoker    Packs/day: 1.00    Years: 35.00    Pack years: 35.00    Types: Cigarettes    Last attempt to quit: 01/23/2011    Years since quitting: 7.1  . Smokeless tobacco: Never Used  Substance and Sexual Activity  . Alcohol use: Yes    Comment: 6 beers weekly  . Drug use: No  . Sexual activity: Not on file  Lifestyle  . Physical activity:    Days per week: Not on file    Minutes per session: Not on file  . Stress: Not on file  Relationships  . Social connections:    Talks on phone: Not on file    Gets together: Not on  file    Attends religious service: Not on file    Active member of club or organization: Not on file    Attends meetings of clubs or organizations: Not on file    Relationship status: Not on file  . Intimate partner violence:    Fear of current or ex partner: Not on file    Emotionally abused: Not on file    Physically abused: Not on file    Forced sexual activity: Not on file  Other Topics Concern  . Not on file  Social History Narrative  . Not on file    Review of Systems: See HPI, otherwise negative ROS  Physical Exam: BP 132/86   Pulse 61   Temp (!) 96.8 F (36 C) (Tympanic)   Resp 18   Ht 6\' 3"  (1.905 m)   Wt 89.4 kg   SpO2 99%   BMI 24.62 kg/m  General:   Alert,  pleasant and cooperative in NAD Head:  Normocephalic and atraumatic. Neck:  Supple; no masses or thyromegaly. Lungs:  Clear throughout to auscultation.    Heart:  Regular rate and rhythm. Abdomen:  Soft, nontender and nondistended. Normal bowel sounds, without guarding, and without rebound.   Neurologic:  Alert and  oriented x4;  grossly normal neurologically.  Impression/Plan: Bradley Barker is here for an colonoscopy to be performed for [facility] colon polyps, last one 04/03/14 with polyps.  Risks, benefits, limitations, and alternatives regarding  colonoscopy have been reviewed with the patient.  Questions have been answered.  All parties agreeable.   Gaylyn Cheers, MD  03/04/2018, 10:28 AM

## 2018-03-04 NOTE — Transfer of Care (Signed)
Immediate Anesthesia Transfer of Care Note  Patient: Bradley Barker  Procedure(s) Performed: COLONOSCOPY WITH PROPOFOL (N/A )  Patient Location: PACU  Anesthesia Type:General  Level of Consciousness: awake, alert  and oriented  Airway & Oxygen Therapy: Patient Spontanous Breathing and Patient connected to nasal cannula oxygen  Post-op Assessment: Report given to RN and Post -op Vital signs reviewed and stable  Post vital signs: Reviewed and stable  Last Vitals:  Vitals Value Taken Time  BP 113/75 03/04/2018 10:58 AM  Temp    Pulse 67 03/04/2018 11:00 AM  Resp 14 03/04/2018 11:00 AM  SpO2 100 % 03/04/2018 11:00 AM  Vitals shown include unvalidated device data.  Last Pain:  Vitals:   03/04/18 1050  TempSrc: (P) Tympanic  PainSc:          Complications: No apparent anesthesia complications

## 2018-03-04 NOTE — Anesthesia Post-op Follow-up Note (Signed)
Anesthesia QCDR form completed.        

## 2018-03-04 NOTE — Op Note (Addendum)
[facility] Patient Name: Bradley Barker Procedure Date: 03/04/2018 10:27 AM MRN: [id number] Account #: [id number] Date of Birth: [birth year] Admit Type: Outpatient Age: 62 Room: [facility] Xxx ENDO ROOM 1 Gender: Male Note Status: Finalized Procedure:            Colonoscopy Indications:          High risk colon cancer surveillance: Personal history                        of colonic polyps Providers:            Scot Jun, MD Referring MD:         Teena Irani. Terance Hart, MD (Referring MD) Medicines:            Propofol per Anesthesia Complications:        No immediate complications. Procedure:            Pre-Anesthesia Assessment:                       - After reviewing the risks and benefits, the patient                        was deemed in satisfactory condition to undergo the                        procedure.                       After obtaining informed consent, the colonoscope was                        passed under direct vision. Throughout the procedure,                        the patient's blood pressure, pulse, and oxygen                        saturations were monitored continuously. The                        Colonoscope was introduced through the anus and                        advanced to the the cecum, identified by appendiceal                        orifice and ileocecal valve. The colonoscopy was                        performed without difficulty. The patient tolerated the                        procedure well. The quality of the bowel preparation                        was excellent. Findings:      Four sessile polyps were found in the rectum. The polyps were diminutive       in size. These polyps were removed with a jumbo cold forceps. Resection       and retrieval were complete.      Multiple small and  large-mouthed diverticula were found in the sigmoid       colon and descending colon.      The exam was otherwise without  abnormality. Impression:           - Four diminutive polyps in the rectum, removed with a                        jumbo cold forceps. Resected and retrieved.                       - Diverticulosis in the sigmoid colon and in the                        descending colon.                       - The examination was otherwise normal. Recommendation:       - Await pathology results. Scot Jun, MD 03/04/2018 10:58:52 AM This report has been signed electronically. Number of Addenda: 0 Note Initiated On: 03/04/2018 10:27 AM Scope Withdrawal Time: 0 hours 11 minutes 25 seconds  Total Procedure Duration: 0 hours 15 minutes 48 seconds       [facility]

## 2018-03-04 NOTE — Anesthesia Preprocedure Evaluation (Signed)
Anesthesia Evaluation  Patient identified by MRN, date of birth, ID band Patient awake    Reviewed: Allergy & Precautions, NPO status , Patient's Chart, lab work & pertinent test results, reviewed documented beta blocker date and time   History of Anesthesia Complications (+) PROLONGED EMERGENCE and history of anesthetic complications  Airway Mallampati: II       Dental   Pulmonary neg sleep apnea, neg COPD, former smoker,           Cardiovascular (-) hypertension(-) Past MI and (-) CHF + dysrhythmias (tachycardia with anxiety) (-) Valvular Problems/Murmurs     Neuro/Psych neg Seizures Anxiety Depression    GI/Hepatic Neg liver ROS, PUD, GERD  Medicated,  Endo/Other  neg diabetes  Renal/GU negative Renal ROS     Musculoskeletal   Abdominal   Peds  Hematology   Anesthesia Other Findings   Reproductive/Obstetrics                             Anesthesia Physical Anesthesia Plan  ASA: II  Anesthesia Plan: General   Post-op Pain Management:    Induction: Intravenous  PONV Risk Score and Plan: 2 and TIVA and Propofol infusion  Airway Management Planned: Nasal Cannula  Additional Equipment:   Intra-op Plan:   Post-operative Plan:   Informed Consent: I have reviewed the patients History and Physical, chart, labs and discussed the procedure including the risks, benefits and alternatives for the proposed anesthesia with the patient or authorized representative who has indicated his/her understanding and acceptance.     Plan Discussed with:   Anesthesia Plan Comments:         Anesthesia Quick Evaluation

## 2018-03-07 ENCOUNTER — Encounter: Payer: Self-pay | Admitting: Unknown Physician Specialty

## 2018-03-07 LAB — SURGICAL PATHOLOGY

## 2018-03-08 ENCOUNTER — Ambulatory Visit: Payer: 59

## 2018-03-08 ENCOUNTER — Ambulatory Visit
Admission: RE | Admit: 2018-03-08 | Discharge: 2018-03-08 | Disposition: A | Discharge status: Home / Self Care | Payer: 59 | Source: Ambulatory Visit | Attending: Oncology | Admitting: Oncology

## 2018-03-08 ENCOUNTER — Inpatient Hospital Stay: Admission: RE | Admit: 2018-03-08 | Payer: 59 | Source: Ambulatory Visit

## 2018-03-08 DIAGNOSIS — Z122 Encounter for screening for malignant neoplasm of respiratory organs: Secondary | ICD-10-CM | POA: Diagnosis not present

## 2018-03-08 DIAGNOSIS — J439 Emphysema, unspecified: Secondary | ICD-10-CM | POA: Insufficient documentation

## 2018-03-08 DIAGNOSIS — Z87891 Personal history of nicotine dependence: Secondary | ICD-10-CM | POA: Insufficient documentation

## 2018-03-09 ENCOUNTER — Encounter: Payer: Self-pay | Admitting: *Deleted

## 2018-03-23 DIAGNOSIS — D485 Neoplasm of uncertain behavior of skin: Secondary | ICD-10-CM | POA: Diagnosis not present

## 2018-03-23 DIAGNOSIS — L821 Other seborrheic keratosis: Secondary | ICD-10-CM | POA: Diagnosis not present

## 2018-03-23 DIAGNOSIS — D1800 Hemangioma unspecified site: Secondary | ICD-10-CM | POA: Diagnosis not present

## 2018-03-25 DIAGNOSIS — I10 Essential (primary) hypertension: Secondary | ICD-10-CM | POA: Diagnosis not present

## 2018-04-04 DIAGNOSIS — Z0000 Encounter for general adult medical examination without abnormal findings: Secondary | ICD-10-CM | POA: Diagnosis not present

## 2019-03-02 ENCOUNTER — Telehealth: Payer: Self-pay | Admitting: *Deleted

## 2019-03-02 NOTE — Telephone Encounter (Signed)
Left message for patient to notify them that it is time to schedule annual low dose lung cancer screening CT scan. Instructed patient to call back to verify information prior to the scan being scheduled.  

## 2019-03-06 ENCOUNTER — Telehealth: Payer: Self-pay | Admitting: *Deleted

## 2019-03-06 DIAGNOSIS — Z122 Encounter for screening for malignant neoplasm of respiratory organs: Secondary | ICD-10-CM

## 2019-03-06 DIAGNOSIS — Z87891 Personal history of nicotine dependence: Secondary | ICD-10-CM

## 2019-03-06 NOTE — Telephone Encounter (Signed)
Patient has been notified that annual lung cancer screening low dose CT scan is due currently or will be in near future. Confirmed that patient is within the age range of 55-77, and asymptomatic, (no signs or symptoms of lung cancer). Patient denies illness that would prevent curative treatment for lung cancer if found. Verified smoking history, (former, quit 01/23/11, 35 pack year). The shared decision making visit was done 03/03/16. Patient is agreeable for CT scan being scheduled.

## 2019-03-14 ENCOUNTER — Inpatient Hospital Stay: Admission: RE | Admit: 2019-03-14 | Payer: 59 | Source: Ambulatory Visit

## 2019-03-15 ENCOUNTER — Other Ambulatory Visit: Payer: Self-pay

## 2019-03-15 ENCOUNTER — Ambulatory Visit
Admission: RE | Admit: 2019-03-15 | Discharge: 2019-03-15 | Disposition: A | Discharge status: Home / Self Care | Payer: 59 | Source: Ambulatory Visit | Attending: Nurse Practitioner | Admitting: Nurse Practitioner

## 2019-03-15 DIAGNOSIS — Z87891 Personal history of nicotine dependence: Secondary | ICD-10-CM

## 2019-03-15 DIAGNOSIS — Z122 Encounter for screening for malignant neoplasm of respiratory organs: Secondary | ICD-10-CM | POA: Insufficient documentation

## 2019-03-20 ENCOUNTER — Encounter: Payer: Self-pay | Admitting: *Deleted

## 2020-03-13 ENCOUNTER — Other Ambulatory Visit: Payer: Self-pay

## 2020-03-13 ENCOUNTER — Ambulatory Visit (INDEPENDENT_AMBULATORY_CARE_PROVIDER_SITE_OTHER): Payer: BC Managed Care – PPO | Admitting: Dermatology

## 2020-03-13 ENCOUNTER — Encounter: Payer: Self-pay | Admitting: Dermatology

## 2020-03-13 ENCOUNTER — Telehealth: Payer: Self-pay | Admitting: *Deleted

## 2020-03-13 DIAGNOSIS — D229 Melanocytic nevi, unspecified: Secondary | ICD-10-CM | POA: Diagnosis not present

## 2020-03-13 DIAGNOSIS — Z87891 Personal history of nicotine dependence: Secondary | ICD-10-CM

## 2020-03-13 DIAGNOSIS — L821 Other seborrheic keratosis: Secondary | ICD-10-CM

## 2020-03-13 DIAGNOSIS — L578 Other skin changes due to chronic exposure to nonionizing radiation: Secondary | ICD-10-CM

## 2020-03-13 DIAGNOSIS — Z1283 Encounter for screening for malignant neoplasm of skin: Secondary | ICD-10-CM

## 2020-03-13 DIAGNOSIS — D1800 Hemangioma unspecified site: Secondary | ICD-10-CM

## 2020-03-13 DIAGNOSIS — L814 Other melanin hyperpigmentation: Secondary | ICD-10-CM

## 2020-03-13 DIAGNOSIS — Z122 Encounter for screening for malignant neoplasm of respiratory organs: Secondary | ICD-10-CM

## 2020-03-13 DIAGNOSIS — Z86018 Personal history of other benign neoplasm: Secondary | ICD-10-CM

## 2020-03-13 NOTE — Progress Notes (Signed)
   Follow-Up Visit   Subjective  Bradley Barker is a 64 y.o. male who presents for the following: Annual Exam (Hx dysplastic nevus ). No new or changing moles, lesions, or spots that the patient has noticed.  The patient presents for Total-Body Skin Exam (TBSE) for skin cancer screening and mole check.  The following portions of the chart were reviewed this encounter and updated as appropriate:  Tobacco  Allergies  Meds  Problems  Med Hx  Surg Hx  Fam Hx     Review of Systems:  No other skin or systemic complaints except as noted in HPI or Assessment and Plan.  Objective  Well appearing patient in no apparent distress; mood and affect are within normal limits.  A full examination was performed including scalp, head, eyes, ears, nose, lips, neck, chest, axillae, abdomen, back, buttocks, bilateral upper extremities, bilateral lower extremities, hands, feet, fingers, toes, fingernails, and toenails. All findings within normal limits unless otherwise noted below.   Assessment & Plan    Lentigines - Scattered tan macules - Discussed due to sun exposure - Benign, observe - Call for any changes  Seborrheic Keratoses - Stuck-on, waxy, tan-brown papules and plaques  - Discussed benign etiology and prognosis. - Observe - Call for any changes  Melanocytic Nevi - Tan-brown and/or pink-flesh-colored symmetric macules and papules - Benign appearing on exam today - Observation - Call clinic for new or changing moles - Recommend daily use of broad spectrum spf 30+ sunscreen to sun-exposed areas.   Hemangiomas - Red papules - Discussed benign nature - Observe - Call for any changes  Actinic Damage - Chronic, secondary to cumulative UV/sun exposure - diffuse scaly erythematous macules with underlying dyspigmentation - Recommend daily broad spectrum sunscreen SPF 30+ to sun-exposed areas, reapply every 2 hours as needed.  - Call for new or changing lesions.  History of Dysplastic  Nevus - R dorsum foot  - No evidence of recurrence today - Recommend regular full body skin exams - Recommend daily broad spectrum sunscreen SPF 30+ to sun-exposed areas, reapply every 2 hours as needed.  - Call if any new or changing lesions are noted between office visits  Skin cancer screening performed today.  Return in about 2 years (around 03/13/2022) for TBSE.  Luther Redo, CMA, am acting as scribe for Sarina Ser, MD .  Documentation: I have reviewed the above documentation for accuracy and completeness, and I agree with the above.  Sarina Ser, MD

## 2020-03-13 NOTE — Telephone Encounter (Signed)
Attempted to contact and schedule lung screening scan. Message left for patient to call back to schedule. 

## 2020-03-14 ENCOUNTER — Encounter: Payer: Self-pay | Admitting: Dermatology

## 2020-03-14 NOTE — Addendum Note (Signed)
Addended by: Lieutenant Diego on: 03/14/2020 09:27 AM   Modules accepted: Orders

## 2020-03-14 NOTE — Telephone Encounter (Signed)
Contacted and scheduled. Former smoker, quit 01/23/11, 35 pack year history

## 2020-03-21 ENCOUNTER — Other Ambulatory Visit: Payer: Self-pay

## 2020-03-21 ENCOUNTER — Ambulatory Visit
Admission: RE | Admit: 2020-03-21 | Discharge: 2020-03-21 | Disposition: A | Discharge status: Home / Self Care | Payer: BC Managed Care – PPO | Source: Ambulatory Visit | Attending: Nurse Practitioner | Admitting: Nurse Practitioner

## 2020-03-21 DIAGNOSIS — Z87891 Personal history of nicotine dependence: Secondary | ICD-10-CM | POA: Insufficient documentation

## 2020-03-21 DIAGNOSIS — Z122 Encounter for screening for malignant neoplasm of respiratory organs: Secondary | ICD-10-CM

## 2020-03-26 ENCOUNTER — Encounter: Payer: Self-pay | Admitting: *Deleted

## 2021-04-17 ENCOUNTER — Telehealth: Payer: Self-pay | Admitting: Acute Care

## 2021-04-17 NOTE — Telephone Encounter (Signed)
disregard

## 2021-07-07 ENCOUNTER — Telehealth: Payer: Self-pay | Admitting: Acute Care

## 2021-07-07 NOTE — Telephone Encounter (Signed)
Left message for pt to call back to schedule f/u lung screening CT scan.  ?

## 2021-07-15 NOTE — Telephone Encounter (Signed)
Left another message at pt's work number to call back to schedule f/u lung screening CT scan.  ?

## 2021-07-22 NOTE — Telephone Encounter (Signed)
Pt called and left message on VM. He asked for a call back at [phone]. When I tried to call that number I only receive a busy signal and am unable to leave a message. I left a message with pt's wfie (PDR) to have pt to call back and leave another number that he can be reached at.  ?

## 2021-07-25 ENCOUNTER — Other Ambulatory Visit: Payer: Self-pay | Admitting: *Deleted

## 2021-07-25 DIAGNOSIS — Z87891 Personal history of nicotine dependence: Secondary | ICD-10-CM

## 2021-07-25 NOTE — Telephone Encounter (Signed)
Pt called and left another message and asked that we call [phone] to schedule his CT. I left a message at this number for pt to call to schedule.  ?

## 2021-08-01 ENCOUNTER — Ambulatory Visit
Admission: RE | Admit: 2021-08-01 | Discharge: 2021-08-01 | Disposition: A | Discharge status: Home / Self Care | Payer: 59 | Source: Ambulatory Visit | Attending: Acute Care | Admitting: Acute Care

## 2021-08-01 DIAGNOSIS — Z87891 Personal history of nicotine dependence: Secondary | ICD-10-CM | POA: Insufficient documentation

## 2021-08-21 ENCOUNTER — Other Ambulatory Visit: Payer: Self-pay

## 2021-08-21 DIAGNOSIS — Z122 Encounter for screening for malignant neoplasm of respiratory organs: Secondary | ICD-10-CM

## 2021-08-21 DIAGNOSIS — Z87891 Personal history of nicotine dependence: Secondary | ICD-10-CM

## 2021-11-12 ENCOUNTER — Other Ambulatory Visit: Payer: Self-pay | Admitting: Orthopedic Surgery

## 2021-11-12 DIAGNOSIS — M25311 Other instability, right shoulder: Secondary | ICD-10-CM

## 2021-11-12 DIAGNOSIS — M25511 Pain in right shoulder: Secondary | ICD-10-CM

## 2021-11-12 DIAGNOSIS — M7522 Bicipital tendinitis, left shoulder: Secondary | ICD-10-CM

## 2021-11-21 ENCOUNTER — Ambulatory Visit
Admission: RE | Admit: 2021-11-21 | Discharge: 2021-11-21 | Disposition: A | Discharge status: Home / Self Care | Payer: 59 | Source: Ambulatory Visit | Attending: Orthopedic Surgery | Admitting: Orthopedic Surgery

## 2021-11-21 DIAGNOSIS — M7522 Bicipital tendinitis, left shoulder: Secondary | ICD-10-CM

## 2021-11-21 DIAGNOSIS — M25511 Pain in right shoulder: Secondary | ICD-10-CM

## 2021-11-21 DIAGNOSIS — M25311 Other instability, right shoulder: Secondary | ICD-10-CM

## 2021-12-08 ENCOUNTER — Encounter: Payer: Self-pay | Admitting: Dermatology

## 2021-12-08 ENCOUNTER — Ambulatory Visit (INDEPENDENT_AMBULATORY_CARE_PROVIDER_SITE_OTHER): Payer: 59 | Admitting: Dermatology

## 2021-12-08 DIAGNOSIS — L814 Other melanin hyperpigmentation: Secondary | ICD-10-CM

## 2021-12-08 DIAGNOSIS — Z1283 Encounter for screening for malignant neoplasm of skin: Secondary | ICD-10-CM | POA: Diagnosis not present

## 2021-12-08 DIAGNOSIS — L821 Other seborrheic keratosis: Secondary | ICD-10-CM

## 2021-12-08 DIAGNOSIS — D1800 Hemangioma unspecified site: Secondary | ICD-10-CM

## 2021-12-08 DIAGNOSIS — D229 Melanocytic nevi, unspecified: Secondary | ICD-10-CM

## 2021-12-08 DIAGNOSIS — D239 Other benign neoplasm of skin, unspecified: Secondary | ICD-10-CM

## 2021-12-08 DIAGNOSIS — Z86018 Personal history of other benign neoplasm: Secondary | ICD-10-CM

## 2021-12-08 DIAGNOSIS — L578 Other skin changes due to chronic exposure to nonionizing radiation: Secondary | ICD-10-CM | POA: Diagnosis not present

## 2021-12-08 NOTE — Patient Instructions (Addendum)
Recommend daily broad spectrum sunscreen SPF 30+ to sun-exposed areas, reapply every 2 hours as needed. Call for new or changing lesions.  Staying in the shade or wearing long sleeves, sun glasses (UVA+UVB protection) and wide brim hats (4-inch brim around the entire circumference of the hat) are also recommended for sun protection.    Melanoma ABCDEs  Melanoma is the most dangerous type of skin cancer, and is the leading cause of death from skin disease.  You are more likely to develop melanoma if you: Have light-colored skin, light-colored eyes, or red or blond hair Spend a lot of time in the sun Tan regularly, either outdoors or in a tanning bed Have had blistering sunburns, especially during childhood Have a close family member who has had a melanoma Have atypical moles or large birthmarks  Early detection of melanoma is key since treatment is typically straightforward and cure rates are extremely high if we catch it early.   The first sign of melanoma is often a change in a mole or a new Ruffino spot.  The ABCDE system is a way of remembering the signs of melanoma.  A for asymmetry:  The two halves do not match. B for border:  The edges of the growth are irregular. C for color:  A mixture of colors are present instead of an even brown color. D for diameter:  Melanomas are usually (but not always) greater than 6mm - the size of a pencil eraser. E for evolution:  The spot keeps changing in size, shape, and color.  Please check your skin once per month between visits. You can use a small mirror in front and a large mirror behind you to keep an eye on the back side or your body.   If you see any new or changing lesions before your next follow-up, please call to schedule a visit.  Please continue daily skin protection including broad spectrum sunscreen SPF 30+ to sun-exposed areas, reapplying every 2 hours as needed when you're outdoors.   Staying in the shade or wearing long sleeves, sun  glasses (UVA+UVB protection) and wide brim hats (4-inch brim around the entire circumference of the hat) are also recommended for sun protection.     Due to recent changes in healthcare laws, you may see results of your pathology and/or laboratory studies on MyChart before the doctors have had a chance to review them. We understand that in some cases there may be results that are confusing or concerning to you. Please understand that not all results are received at the same time and often the doctors may need to interpret multiple results in order to provide you with the best plan of care or course of treatment. Therefore, we ask that you please give us 2 business days to thoroughly review all your results before contacting the office for clarification. Should we see a critical lab result, you will be contacted sooner.   If You Need Anything After Your Visit  If you have any questions or concerns for your doctor, please call our main line at [phone] and press option 4 to reach your doctor's medical assistant. If no one answers, please leave a voicemail as directed and we will return your call as soon as possible. Messages left after 4 pm will be answered the following business day.   You may also send us a message via MyChart. We typically respond to MyChart messages within 1-2 business days.  For prescription refills, please ask your pharmacy to contact   our office. Our fax number is [phone].  If you have an urgent issue when the clinic is closed that cannot wait until the next business day, you can page your doctor at the number below.    Please note that while we do our best to be available for urgent issues outside of office hours, we are not available 24/7.   If you have an urgent issue and are unable to reach us, you may choose to seek medical care at your doctor's office, retail clinic, urgent care center, or emergency room.  If you have a medical emergency, please immediately  call 911 or go to the emergency department.  Pager Numbers  - Dr. Kowalski: [phone]  - Dr. Moye: [phone]  - Dr. Stewart: [phone]  In the event of inclement weather, please call our main line at [phone] for an update on the status of any delays or closures.  Dermatology Medication Tips: Please keep the boxes that topical medications come in in order to help keep track of the instructions about where and how to use these. Pharmacies typically print the medication instructions only on the boxes and not directly on the medication tubes.   If your medication is too expensive, please contact our office at [phone] option 4 or send us a message through MyChart.   We are unable to tell what your co-pay for medications will be in advance as this is different depending on your insurance coverage. However, we may be able to find a substitute medication at lower cost or fill out paperwork to get insurance to cover a needed medication.   If a prior authorization is required to get your medication covered by your insurance company, please allow us 1-2 business days to complete this process.  Drug prices often vary depending on where the prescription is filled and some pharmacies may offer cheaper prices.  The website www.goodrx.com contains coupons for medications through different pharmacies. The prices here do not account for what the cost may be with help from insurance (it may be cheaper with your insurance), but the website can give you the price if you did not use any insurance.  - You can print the associated coupon and take it with your prescription to the pharmacy.  - You may also stop by our office during regular business hours and pick up a GoodRx coupon card.  - If you need your prescription sent electronically to a different pharmacy, notify our office through [facility] MyChart or by phone at [phone] option 4.     Si Usted Necesita Algo Despus de Su  Visita  Tambin puede enviarnos un mensaje a travs de MyChart. Por lo general respondemos a los mensajes de MyChart en el transcurso de 1 a 2 das hbiles.  Para renovar recetas, por favor pida a su farmacia que se ponga en contacto con nuestra oficina. Nuestro nmero de fax es el [phone].  Si tiene un asunto urgente cuando la clnica est cerrada y que no puede esperar hasta el siguiente da hbil, puede llamar/localizar a su doctor(a) al nmero que aparece a continuacin.   Por favor, tenga en cuenta que aunque hacemos todo lo posible para estar disponibles para asuntos urgentes fuera del horario de oficina, no estamos disponibles las 24 horas del da, los 7 das de la semana.   Si tiene un problema urgente y no puede comunicarse con nosotros, puede optar por buscar atencin mdica  en el consultorio de su doctor(a), en una clnica privada,   en un centro de atencin urgente o en una sala de emergencias.  Si tiene una emergencia mdica, por favor llame inmediatamente al 911 o vaya a la sala de emergencias.  Nmeros de bper  - Dr. Kowalski: [phone]  - Dra. Moye: [phone]  - Dra. Stewart: [phone]  En caso de inclemencias del tiempo, por favor llame a nuestra lnea principal al [phone] para una actualizacin sobre el estado de cualquier retraso o cierre.  Consejos para la medicacin en dermatologa: Por favor, guarde las cajas en las que vienen los medicamentos de uso tpico para ayudarle a seguir las instrucciones sobre dnde y cmo usarlos. Las farmacias generalmente imprimen las instrucciones del medicamento slo en las cajas y no directamente en los tubos del medicamento.   Si su medicamento es muy caro, por favor, pngase en contacto con nuestra oficina llamando al [phone] y presione la opcin 4 o envenos un mensaje a travs de MyChart.   No podemos decirle cul ser su copago por los medicamentos por adelantado ya que esto es diferente dependiendo de la  cobertura de su seguro. Sin embargo, es posible que podamos encontrar un medicamento sustituto a menor costo o llenar un formulario para que el seguro cubra el medicamento que se considera necesario.   Si se requiere una autorizacin previa para que su compaa de seguros cubra su medicamento, por favor permtanos de 1 a 2 das hbiles para completar este proceso.  Los precios de los medicamentos varan con frecuencia dependiendo del lugar de dnde se surte la receta y alguna farmacias pueden ofrecer precios ms baratos.  El sitio web www.goodrx.com tiene cupones para medicamentos de diferentes farmacias. Los precios aqu no tienen en cuenta lo que podra costar con la ayuda del seguro (puede ser ms barato con su seguro), pero el sitio web puede darle el precio si no utiliz ningn seguro.  - Puede imprimir el cupn correspondiente y llevarlo con su receta a la farmacia.  - Tambin puede pasar por nuestra oficina durante el horario de atencin regular y recoger una tarjeta de cupones de GoodRx.  - Si necesita que su receta se enve electrnicamente a una farmacia diferente, informe a nuestra oficina a travs de MyChart de [facility] o por telfono llamando al [phone] y presione la opcin 4.  

## 2021-12-08 NOTE — Progress Notes (Unsigned)
   Follow-Up Visit   Subjective  Bradley Barker is a 66 y.o. male who presents for the following: Annual Exam (Hx dysplastic nevus. Areas of concern on back). The patient presents for Total-Body Skin Exam (TBSE) for skin cancer screening and mole check.  The patient has spots, moles and lesions to be evaluated, some may be new or changing and the patient has concerns that these could be cancer.  The following portions of the chart were reviewed this encounter and updated as appropriate:  Tobacco  Allergies  Meds  Problems  Med Hx  Surg Hx  Fam Hx     Review of Systems: No other skin or systemic complaints except as noted in HPI or Assessment and Plan.  Objective  Well appearing patient in no apparent distress; mood and affect are within normal limits.  A full examination was performed including scalp, head, eyes, ears, nose, lips, neck, chest, axillae, abdomen, back, buttocks, bilateral upper extremities, bilateral lower extremities, hands, feet, fingers, toes, fingernails, and toenails. All findings within normal limits unless otherwise noted below.   Assessment & Plan   History of Dysplastic Nevi. Right dorsum foot, mild. 03/2015. - No evidence of recurrence today - Recommend regular full body skin exams - Recommend daily broad spectrum sunscreen SPF 30+ to sun-exposed areas, reapply every 2 hours as needed.  - Call if any new or changing lesions are noted between office visits   Lentigines - Scattered tan macules - Due to sun exposure - Benign-appearing, observe - Recommend daily broad spectrum sunscreen SPF 30+ to sun-exposed areas, reapply every 2 hours as needed. - Call for any changes  Seborrheic Keratoses - Stuck-on, waxy, tan-brown papules and/or plaques  - Benign-appearing - Discussed benign etiology and prognosis. - Observe - Call for any changes  Melanocytic Nevi - Tan-brown and/or pink-flesh-colored symmetric macules and papules - Benign appearing on exam  today - Observation - Call clinic for new or changing moles - Recommend daily use of broad spectrum spf 30+ sunscreen to sun-exposed areas.   Hemangiomas - Red papules - Discussed benign nature - Observe - Call for any changes  Actinic Damage - Chronic condition, secondary to cumulative UV/sun exposure - diffuse scaly erythematous macules with underlying dyspigmentation - Recommend daily broad spectrum sunscreen SPF 30+ to sun-exposed areas, reapply every 2 hours as needed.  - Staying in the shade or wearing long sleeves, sun glasses (UVA+UVB protection) and wide brim hats (4-inch brim around the entire circumference of the hat) are also recommended for sun protection.  - Call for new or changing lesions.  Skin cancer screening performed today.  Skin cancer screening  Actinic skin damage  Dysplastic nevi  Seborrheic keratosis   Return for TBSE in 1-2 years.  I, Emelia Salisbury, CMA, am acting as scribe for Sarina Ser, MD. Documentation: I have reviewed the above documentation for accuracy and completeness, and I agree with the above.  Sarina Ser, MD

## 2021-12-08 NOTE — Progress Notes (Unsigned)
   Follow-Up Visit   Subjective  Bradley Barker is a 66 y.o. male who presents for the following: Annual Exam (Hx dysplastic nevus ). The patient presents for Total-Body Skin Exam (TBSE) for skin cancer screening and mole check.  The patient has spots, moles and lesions to be evaluated, some may be new or changing and the patient has concerns that these could be cancer.     The following portions of the chart were reviewed this encounter and updated as appropriate:       Review of Systems:  No other skin or systemic complaints except as noted in HPI or Assessment and Plan.  Objective  Well appearing patient in no apparent distress; mood and affect are within normal limits.  A full examination was performed including scalp, head, eyes, ears, nose, lips, neck, chest, axillae, abdomen, back, buttocks, bilateral upper extremities, bilateral lower extremities, hands, feet, fingers, toes, fingernails, and toenails. All findings within normal limits unless otherwise noted below.    Assessment & Plan   Lentigines - Scattered tan macules - Due to sun exposure - Benign-appearing, observe - Recommend daily broad spectrum sunscreen SPF 30+ to sun-exposed areas, reapply every 2 hours as needed. - Call for any changes  Seborrheic Keratoses - Stuck-on, waxy, tan-brown papules and/or plaques  - Benign-appearing - Discussed benign etiology and prognosis. - Observe - Call for any changes  Melanocytic Nevi - Tan-brown and/or pink-flesh-colored symmetric macules and papules - Benign appearing on exam today - Observation - Call clinic for new or changing moles - Recommend daily use of broad spectrum spf 30+ sunscreen to sun-exposed areas.   Hemangiomas - Red papules - Discussed benign nature - Observe - Call for any changes  Actinic Damage - Chronic condition, secondary to cumulative UV/sun exposure - diffuse scaly erythematous macules with underlying dyspigmentation - Recommend daily  broad spectrum sunscreen SPF 30+ to sun-exposed areas, reapply every 2 hours as needed.  - Staying in the shade or wearing long sleeves, sun glasses (UVA+UVB protection) and wide brim hats (4-inch brim around the entire circumference of the hat) are also recommended for sun protection.  - Call for new or changing lesions.  History of Dysplastic Nevus - No evidence of recurrence today - Recommend regular full body skin exams - Recommend daily broad spectrum sunscreen SPF 30+ to sun-exposed areas, reapply every 2 hours as needed.  - Call if any new or changing lesions are noted between office visits  Skin cancer screening performed today.  Return in about 1 year (around 12/09/2022) for TBSE.  Luther Redo, CMA, am acting as scribe for Sarina Ser, MD .

## 2021-12-09 ENCOUNTER — Encounter: Payer: Self-pay | Admitting: Dermatology

## 2021-12-15 ENCOUNTER — Other Ambulatory Visit: Payer: Self-pay | Admitting: Orthopedic Surgery

## 2021-12-17 ENCOUNTER — Encounter: Payer: Self-pay | Admitting: Orthopedic Surgery

## 2021-12-26 ENCOUNTER — Other Ambulatory Visit: Payer: Self-pay

## 2021-12-26 ENCOUNTER — Ambulatory Visit
Admission: RE | Admit: 2021-12-26 | Discharge: 2021-12-26 | Disposition: A | Discharge status: Home / Self Care | Payer: 59 | Attending: Orthopedic Surgery | Admitting: Orthopedic Surgery

## 2021-12-26 ENCOUNTER — Ambulatory Visit (AMBULATORY_SURGERY_CENTER): Payer: 59 | Admitting: Anesthesiology

## 2021-12-26 ENCOUNTER — Ambulatory Visit: Payer: 59 | Admitting: Anesthesiology

## 2021-12-26 ENCOUNTER — Encounter
Admission: RE | Disposition: A | Discharge status: Home / Self Care | Payer: Self-pay | Source: Home / Self Care | Attending: Orthopedic Surgery

## 2021-12-26 ENCOUNTER — Encounter: Payer: Self-pay | Admitting: Orthopedic Surgery

## 2021-12-26 DIAGNOSIS — K219 Gastro-esophageal reflux disease without esophagitis: Secondary | ICD-10-CM | POA: Diagnosis not present

## 2021-12-26 DIAGNOSIS — Z79899 Other long term (current) drug therapy: Secondary | ICD-10-CM | POA: Diagnosis not present

## 2021-12-26 DIAGNOSIS — M75101 Unspecified rotator cuff tear or rupture of right shoulder, not specified as traumatic: Secondary | ICD-10-CM | POA: Diagnosis not present

## 2021-12-26 DIAGNOSIS — M25811 Other specified joint disorders, right shoulder: Secondary | ICD-10-CM | POA: Insufficient documentation

## 2021-12-26 DIAGNOSIS — M7541 Impingement syndrome of right shoulder: Secondary | ICD-10-CM | POA: Diagnosis not present

## 2021-12-26 DIAGNOSIS — M7521 Bicipital tendinitis, right shoulder: Secondary | ICD-10-CM | POA: Insufficient documentation

## 2021-12-26 DIAGNOSIS — X500XXA Overexertion from strenuous movement or load, initial encounter: Secondary | ICD-10-CM | POA: Insufficient documentation

## 2021-12-26 DIAGNOSIS — M65811 Other synovitis and tenosynovitis, right shoulder: Secondary | ICD-10-CM | POA: Insufficient documentation

## 2021-12-26 DIAGNOSIS — S46011A Strain of muscle(s) and tendon(s) of the rotator cuff of right shoulder, initial encounter: Secondary | ICD-10-CM | POA: Diagnosis not present

## 2021-12-26 DIAGNOSIS — M19011 Primary osteoarthritis, right shoulder: Secondary | ICD-10-CM

## 2021-12-26 DIAGNOSIS — Z87891 Personal history of nicotine dependence: Secondary | ICD-10-CM | POA: Diagnosis not present

## 2021-12-26 HISTORY — DX: Sciatica, right side: M54.31

## 2021-12-26 SURGERY — SHOULDER ARTHROSCOPY WITH SUBACROMIAL DECOMPRESSION AND DISTAL CLAVICLE EXCISION
Anesthesia: Regional | Site: Shoulder | Laterality: Right

## 2021-12-26 MED ORDER — VASOPRESSIN 20 UNIT/ML IV SOLN
INTRAVENOUS | Status: DC | PRN
  Administered 2021-12-26 (×4): 1 [IU] via INTRAVENOUS

## 2021-12-26 MED ORDER — PROPOFOL 10 MG/ML IV BOLUS
INTRAVENOUS | Status: DC | PRN
  Administered 2021-12-26: 200 mg via INTRAVENOUS

## 2021-12-26 MED ORDER — ONDANSETRON HCL 4 MG/2ML IJ SOLN
INTRAMUSCULAR | Status: DC | PRN
  Administered 2021-12-26: 4 mg via INTRAVENOUS

## 2021-12-26 MED ORDER — RINGERS IRRIGATION IR SOLN
Status: DC | PRN
  Administered 2021-12-26 (×3): 6000 mL

## 2021-12-26 MED ORDER — OXYCODONE HCL 5 MG PO TABS: 5.0000 mg | ORAL_TABLET | ORAL | 0 refills | Status: DC | PRN

## 2021-12-26 MED ORDER — DOCUSATE SODIUM 100 MG PO CAPS: 100.0000 mg | ORAL_CAPSULE | Freq: Two times a day (BID) | ORAL | 2 refills | Status: DC

## 2021-12-26 MED ORDER — ASPIRIN 325 MG PO TBEC: 325.0000 mg | DELAYED_RELEASE_TABLET | Freq: Every day | ORAL | 0 refills | Status: AC

## 2021-12-26 MED ORDER — DEXAMETHASONE SODIUM PHOSPHATE 4 MG/ML IJ SOLN
INTRAMUSCULAR | Status: DC | PRN
  Administered 2021-12-26: 4 mg via INTRAVENOUS

## 2021-12-26 MED ORDER — LACTATED RINGERS IV SOLN: INTRAVENOUS | Status: DC

## 2021-12-26 MED ORDER — EPHEDRINE SULFATE (PRESSORS) 50 MG/ML IJ SOLN
INTRAMUSCULAR | Status: DC | PRN
  Administered 2021-12-26 (×9): 5 mg via INTRAVENOUS

## 2021-12-26 MED ORDER — SENNA 8.6 MG PO TABS: 2.0000 | ORAL_TABLET | Freq: Every day | ORAL | 0 refills | Status: DC

## 2021-12-26 MED ORDER — CEFAZOLIN SODIUM-DEXTROSE 2-4 GM/100ML-% IV SOLN
2.0000 g | INTRAVENOUS | Status: AC
  Administered 2021-12-26: 2 g via INTRAVENOUS

## 2021-12-26 MED ORDER — ONDANSETRON 4 MG PO TBDP: 4.0000 mg | ORAL_TABLET | Freq: Three times a day (TID) | ORAL | 0 refills | Status: DC | PRN

## 2021-12-26 MED ORDER — ACETAMINOPHEN 500 MG PO TABS: 1000.0000 mg | ORAL_TABLET | Freq: Three times a day (TID) | ORAL | 2 refills | Status: DC

## 2021-12-26 MED ORDER — MIDAZOLAM HCL 5 MG/5ML IJ SOLN
INTRAMUSCULAR | Status: DC | PRN
  Administered 2021-12-26: 1 mg via INTRAVENOUS

## 2021-12-26 MED ORDER — PHENYLEPHRINE HCL (PRESSORS) 10 MG/ML IV SOLN
INTRAVENOUS | Status: DC | PRN
  Administered 2021-12-26 (×2): 100 ug via INTRAVENOUS

## 2021-12-26 MED ORDER — GLYCOPYRROLATE 0.2 MG/ML IJ SOLN
INTRAMUSCULAR | Status: DC | PRN
  Administered 2021-12-26 (×2): .1 mg via INTRAVENOUS

## 2021-12-26 MED ORDER — OXYCODONE HCL 5 MG PO TABS
5.0000 mg | ORAL_TABLET | ORAL | Status: DC | PRN
  Administered 2021-12-26: 5 mg via ORAL

## 2021-12-26 MED ORDER — LACTATED RINGERS IV SOLN
INTRAVENOUS | Status: DC | PRN
  Administered 2021-12-26: 12000 mL

## 2021-12-26 SURGICAL SUPPLY — 55 items
ADAPTER IRRIG TUBE 2 SPIKE SOL (ADAPTER) ×2 IMPLANT
ADPR IRR PORT MULTIBAG TUBE (MISCELLANEOUS) ×1
ADPR TBG 2 SPK PMP STRL ASCP (ADAPTER) ×2
ANCH SUT 2 SWLK 19.1 CLS EYLT (Anchor) ×2 IMPLANT
ANCH SUT 2.9 PUSHLOCK ANCH (Orthopedic Implant) ×1 IMPLANT
ANCH SUT 5 3.9 CRKSW KNTLS (Anchor) ×1 IMPLANT
ANCHOR 3.9 PEEK CORKSCREW 5MTS (Anchor) IMPLANT
ANCHOR ICONIX SPEED 2.3 (Anchor) IMPLANT
ANCHOR SWIVELOCK BIO 4.75X19.1 (Anchor) IMPLANT
APL PRP STRL LF DISP 70% ISPRP (MISCELLANEOUS) ×1
BLADE SHAVER 4.5X7 STR FR (MISCELLANEOUS) ×1 IMPLANT
BUR BR 5.5 WIDE MOUTH (BURR) ×1 IMPLANT
CANNULA TWIST IN 8.25X7CM (CANNULA) IMPLANT
CHLORAPREP W/TINT 26 (MISCELLANEOUS) ×1 IMPLANT
COOLER POLAR GLACIER W/PUMP (MISCELLANEOUS) ×1 IMPLANT
COVER LIGHT HANDLE UNIVERSAL (MISCELLANEOUS) ×2 IMPLANT
DRAPE INCISE IOBAN 66X45 STRL (DRAPES) ×1 IMPLANT
DRAPE U-SHAPE 48X52 POLY STRL (PACKS) ×1 IMPLANT
DRSG TEGADERM 4X4.75 (GAUZE/BANDAGES/DRESSINGS) ×3 IMPLANT
ELECT REM PT RETURN 9FT ADLT (ELECTROSURGICAL) ×1
ELECTRODE REM PT RTRN 9FT ADLT (ELECTROSURGICAL) IMPLANT
GAUZE SPONGE 4X4 12PLY STRL (GAUZE/BANDAGES/DRESSINGS) ×1 IMPLANT
GAUZE XEROFORM 1X8 LF (GAUZE/BANDAGES/DRESSINGS) ×1 IMPLANT
GLOVE SRG 8 PF TXTR STRL LF DI (GLOVE) ×3 IMPLANT
GLOVE SURG ENC MOIS LTX SZ7.5 (GLOVE) ×4 IMPLANT
GLOVE SURG UNDER POLY LF SZ8 (GLOVE) ×3
GOWN STRL REIN 2XL XLG LVL4 (GOWN DISPOSABLE) ×1 IMPLANT
GOWN STRL REUS W/ TWL LRG LVL3 (GOWN DISPOSABLE) ×3 IMPLANT
GOWN STRL REUS W/TWL LRG LVL3 (GOWN DISPOSABLE) ×3
IV LACTATED RINGER IRRG 3000ML (IV SOLUTION) ×11
IV LR IRRIG 3000ML ARTHROMATIC (IV SOLUTION) ×6 IMPLANT
KIT CORKSCREW KNTLS 3.9 S/T/P (INSTRUMENTS) IMPLANT
KIT STABILIZATION SHOULDER (MISCELLANEOUS) ×1 IMPLANT
KIT TURNOVER KIT A (KITS) ×1 IMPLANT
MANIFOLD NEPTUNE II (INSTRUMENTS) ×1 IMPLANT
MASK FACE SPIDER DISP (MASK) ×1 IMPLANT
MAT GRAY ABSORB FLUID 28X50 (MISCELLANEOUS) ×2 IMPLANT
PACK ARTHROSCOPY SHOULDER (MISCELLANEOUS) ×1 IMPLANT
PAD ABD DERMACEA PRESS 5X9 (GAUZE/BANDAGES/DRESSINGS) ×2 IMPLANT
PAD WRAPON POLAR SHDR XLG (MISCELLANEOUS) ×1 IMPLANT
PASSER SUT FIRSTPASS SELF (INSTRUMENTS) IMPLANT
SET Y ADAPTER MULIT-BAG IRRIG (MISCELLANEOUS) IMPLANT
SPONGE T-LAP 18X18 ~~LOC~~+RFID (SPONGE) ×1 IMPLANT
SUT ETHILON 3-0 FS-10 30 BLK (SUTURE) ×2
SUT VIC AB 0 CT1 36 (SUTURE) ×1 IMPLANT
SUTURE EHLN 3-0 FS-10 30 BLK (SUTURE) ×1 IMPLANT
SUTURE TAPE 1.3 40 TPR END (SUTURE) IMPLANT
SUTURETAPE 1.3 40 TPR END (SUTURE) ×1
SYSTEM IMPL TENODESIS LNT 2.9 (Orthopedic Implant) IMPLANT
TAPE MICROFOAM 4IN (TAPE) ×1 IMPLANT
TUBING CONNECTING 10 (TUBING) ×1 IMPLANT
TUBING INFLOW SET DBFLO PUMP (TUBING) ×1 IMPLANT
TUBING OUTFLOW SET DBLFO PUMP (TUBING) ×1 IMPLANT
WAND WEREWOLF FLOW 90D (MISCELLANEOUS) ×1 IMPLANT
WRAPON POLAR PAD SHDR XLG (MISCELLANEOUS) ×1

## 2021-12-26 NOTE — Anesthesia Preprocedure Evaluation (Addendum)
Anesthesia Evaluation  Patient identified by MRN, date of birth, ID band  History of Anesthesia Complications (+) PROLONGED EMERGENCE and history of anesthetic complications  Airway Mallampati: II  TM Distance: >3 FB Neck ROM: Full    Dental no notable dental hx.    Pulmonary former smoker,    Pulmonary exam normal        Cardiovascular Exercise Tolerance: Good Normal cardiovascular exam+ dysrhythmias (palpitations (benign), takes propanolol)      Neuro/Psych negative neurological ROS     GI/Hepatic Neg liver ROS, GERD (reflux and duodenal ulcer many years ago)  Medicated and Controlled,  Endo/Other  negative endocrine ROS  Renal/GU negative Renal ROS     Musculoskeletal   Abdominal   Peds  Hematology negative hematology ROS (+)   Anesthesia Other Findings   Reproductive/Obstetrics                            Anesthesia Physical Anesthesia Plan  ASA: 2  Anesthesia Plan: General and Regional   Post-op Pain Management: Regional block   Induction: Intravenous  PONV Risk Score and Plan: 2 and Ondansetron, Dexamethasone, Treatment may vary due to age or medical condition and Midazolam  Airway Management Planned: LMA  Additional Equipment: None  Intra-op Plan:   Post-operative Plan: Extubation in OR  Informed Consent: I have reviewed the patients History and Physical, chart, labs and discussed the procedure including the risks, benefits and alternatives for the proposed anesthesia with the patient or authorized representative who has indicated his/her understanding and acceptance.     Dental advisory given  Plan Discussed with: CRNA  Anesthesia Plan Comments:         Anesthesia Quick Evaluation

## 2021-12-26 NOTE — Op Note (Addendum)
SURGERY DATE: 12/26/2021   PRE-OP DIAGNOSIS:  1. Right subacromial impingement 2. Right biceps tendinopathy 3. Right rotator cuff tear (subscapularis & supraspinatus) 4. Right acromioclavicular joint arthritis  POST-OP DIAGNOSIS: 1. Right subacromial impingement 2. Right biceps tendinopathy 3. Right rotator cuff tear (partial-thickness upper border subscapularis & full-thickness supraspinatus) 4. Right acromioclavicular joint arthritis  PROCEDURES:  1. Right arthroscopic rotator cuff repair (supraspinatus and subscapularis) 2. Right arthroscopic biceps tenodesis 3. Right arthroscopic subacromial decompression 4. Right arthroscopic extensive debridement of shoulder (glenohumeral and subacromial spaces) 5. Right arthroscopic distal clavicle excision  SURGEON: Cato Mulligan, MD   ASSISTANT: Anitra Lauth, PA   ANESTHESIA: Gen with Exparel interscalene block   ESTIMATED BLOOD LOSS: 5cc   DRAINS:  none   TOTAL IV FLUIDS: per anesthesia      SPECIMENS: none   IMPLANTS:  - Arthrex 2.74m PushLock x 1 - Arthrex 3.9 mm knotless corkscrew x1 - Arthrex 4.752mSwiveLock x 2 - Iconix SPEED double loaded with 1.2 and 2.53m27mape x 2     OPERATIVE FINDINGS:  Examination under anesthesia: A careful examination under anesthesia was performed.  Passive range of motion was: FF: 150; ER at side: 50; ER in abduction: 90; IR in abduction: 45.  Anterior load shift: NT.  Posterior load shift: NT.  Sulcus in neutral: NT.  Sulcus in ER: NT.     Intra-operative findings: A thorough arthroscopic examination of the shoulder was performed.  The findings are: 1. Biceps tendon: tendinopathy with significant erythema and thickening 2. Superior labrum: erythema with a type II SLAP tear 3. Posterior labrum and capsule: normal 4. Inferior capsule and inferior recess: normal 5. Glenoid cartilage surface: Normal 6. Supraspinatus attachment: full-thickness tear with split at the infraspinatus/supraspinatus  border 7. Posterior rotator cuff attachment: normal 8. Humeral head articular cartilage: normal 9. Rotator interval: significant synovitis 10: Subscapularis tendon: Partial thickness upper border tear 11. Anterior labrum: Mildly degenerative 12. IGHL: normal   OPERATIVE REPORT:    Indications for procedure:  Bradley Barker a [age]o. male with over 3 months of right shoulder pain the day after he was attempting to lift a kayak over his head and had to catch it to prevent it from falling down.  He has had difficulty with overhead motion since that time with sensations of weakness.  He has failed extensive nonoperative management.  Clinical exam and MRI were suggestive of rotator cuff tear, biceps tendinopathy, acromioclavicular joint arthritis and subacromial impingement. After discussion of risks, benefits, and alternatives to surgery, the patient elected to proceed.    Procedure in detail:   I identified Bradley Barker the pre-operative holding area.  I marked the operative shoulder with my initials. I reviewed the risks and benefits of the proposed surgical intervention, and the patient wished to proceed.  Anesthesia was then performed with an Exparel interscalene block.  The patient was transferred to the operative suite and placed in the beach chair position.     Appropriate IV antibiotics were administered prior to incision. The operative upper extremity was then prepped and draped in standard fashion. A time out was performed confirming the correct extremity, correct patient, and correct procedure.    I then created a standard posterior portal with an 11 blade. The glenohumeral joint was easily entered with a blunt trocar and the arthroscope introduced. The findings of diagnostic arthroscopy are described above. I debrided degenerative tissue including the synovitic tissue about the rotator interval and anterior and  superior labrum. I then coagulated the inflamed synovium to obtain hemostasis  and reduce the risk of post-operative swelling using an Arthrocare radiofrequency device.   I then turned my attention to the arthroscopic biceps tenodesis. The Loop n Tack technique was used to pass a FiberTape through the biceps in a locked fashion adjacent to the biceps anchor.  A hole for a 2.9 mm Arthrex PushLock was drilled in the bicipital groove just superior to the subscapularis tendon insertion.  The biceps tendon was then cut and the biceps anchor complex was debrided down to a stable base on the superior labrum.  The FiberTape was loaded onto the PushLock anchor and impacted into place into the previously drilled hole in the bicipital groove.  This appropriately secured the biceps into the bicipital groove and took it off of tension.   Next, arthroscopic repair of the subscapularis was performed. The lesser tuberosity footprint was prepared with a combination of electrocautery and an arthroscopic curette.  An Arthrex knotless corkscrew was placed into the lesser tuberosity footprint from the anterior portal.  A BirdBeak was used to shuttle the repair suture through the upper border of the subscapularis tendon.  The suture was then shuttled through the anchor. With the arm in neutral rotation, the repair was tensioned appropriately. This appropriately reduced the subscapularis tear.  The arm was then internally and externally rotated and the subscapularis was noted to move appropriately with rotation.  The remainder of the suture was then cut.  Next, the arthroscope was then introduced into the subacromial space. A direct lateral portal was created with an 11-blade after spinal needle localization. An extensive subacromial bursectomy and debridement was performed using a combination of the shaver and Arthrocare wand. The entire acromial undersurface was exposed and the CA ligament was subperiosteally elevated to expose the anterior acromial hook. A burr was used to create a flat anterior and lateral  aspect of the acromion, converting it from a Type 2 to a Type 1 acromion. Care was made to keep the deltoid fascia intact.   I then turned my attention to the arthroscopic distal clavicle excision. I identified the acromioclavicular joint. Surrounding bursal tissue was debrided and the edges of the joint were identified. I used the 5.36m barrel burr to remove the distal clavicle parallel to the edge of the acromion. I was able to fit two widths of the burr into the space between the distal clavicle and acromion, signifying that I had removed ~145mof distal clavicle. This was confirmed by viewing anteriorly and introducing a probe with measuring marks from the lateral portal. Hemostasis was achieved with an Arthrocare wand.   Next, I created an accessory posterolateral portal to assist with visualization and instrumentation.  I debrided the poor quality edges of the supraspinatus tendon.  This was a L-shaped tear of the supraspinatus with long limb posterior at the infraspinatus/supraspinatus junction.  I prepared the footprint using a burr to expose bleeding bone.     I first placed a margin convergence suture through the long limb of the L with suture tape to converted this from on L-shaped tear to a U-shaped tear.  then percutaneously placed 1 Iconix SPEED medial row anchor along the anterior portion of the tear at the articular margin. Another SPEED anchor was placed along the posterior portion of the tear at the articular margin. I then shuttled all 8 strands of tape through the rotator cuff just lateral to the musculotendinous junction using a FirstPass suture passer spanning  the anterior to posterior extent of the tear. The posterior strands of each suture were passed through an [employer] anchor.  This was placed approximately 2 cm distal to the lateral edge of the footprint in line with the posterior aspect of the tear with appropriate tensioning of each suture prior to final fixation.   Similarly, the anterior strands of each suture were passed through another SwiveLock anchor along the anterior margin of the tear.  There were 1 small dogear anteriorly. The knotless mechanism of the SwiveLock anchor were utilized to reduce the dogear.  This construct allowed for excellent reapproximation of the rotator cuff to its native footprint without undue tension.  Appropriate compression was achieved.  The repair was stable to external and internal rotation.   Fluid was evacuated from the shoulder, and the portals were closed with 3-0 Nylon. Xeroform was applied to the portals. A sterile dressing was applied, followed by a Polar Care sleeve and a SlingShot shoulder immobilizer/sling. The patient was awakened from anesthesia without difficulty and was transferred to the PACU in stable condition.   Additionally, this case had increased complexity compared to standard arthroscopic rotator cuff repair given the involvement of the subscapularis tear. Repair of this tear increased surgical time by 20 minutes and increased complexity due to additional preparation and repair of subscapularis tear and use of additional implants, which would have otherwise not occurred.    Of note, assistance from a PA was essential to performing the surgery.  PA was present for the entire surgery.  PA assisted with patient positioning, retraction, instrumentation, and wound closure. The surgery would have been more difficult and had longer operative time without PA assistance.    COMPLICATIONS: none   DISPOSITION: plan for discharge home after recovery in PACU     POSTOPERATIVE PLAN: Remain in sling (except hygiene and elbow/wrist/hand RoM exercises as instructed by PT) x 6 weeks and NWB for this time. PT to begin 3-4 days after surgery.  Large rotator cuff repair rehab protocol. ASA '325mg'$  daily x 2 weeks for DVT ppx.

## 2021-12-26 NOTE — Transfer of Care (Signed)
Immediate Anesthesia Transfer of Care Note  Patient: Bradley Barker  Procedure(s) Performed: Right shoulder arthroscopic supraspinatus and subscapularis repair, subacromial decompression, distal clavicle excision, and biceps tenodesis (Right: Shoulder)  Patient Location: PACU  Anesthesia Type: General, Regional  Level of Consciousness: awake, alert  and patient cooperative  Airway and Oxygen Therapy: Patient Spontanous Breathing and Patient connected to supplemental oxygen  Post-op Assessment: Post-op Vital signs reviewed, Patient's Cardiovascular Status Stable, Respiratory Function Stable, Patent Airway and No signs of Nausea or vomiting  Post-op Vital Signs: Reviewed and stable  Complications: There were no known notable events for this encounter.

## 2021-12-26 NOTE — Discharge Instructions (Addendum)
Post-Op Instructions - Rotator Cuff Repair  1. Bracing: You will wear a shoulder immobilizer or sling for 6 weeks.   2. Driving: No driving for 3 weeks post-op. When driving, do not wear the immobilizer. Ideally, we recommend no driving for 6 weeks while sling is in place as one arm will be immobilized.   3. Activity: No active lifting for 2 months. Wrist, hand, and elbow motion only. Avoid lifting the upper arm away from the body except for hygiene. You are permitted to bend and straighten the elbow passively only (no active elbow motion). You may use your hand and wrist for typing, writing, and managing utensils (cutting food). Do not lift more than a coffee cup for 8 weeks.  When sleeping or resting, inclined positions (recliner chair or wedge pillow) and a pillow under the forearm for support may provide better comfort for up to 4 weeks.  Avoid long distance travel for 4 weeks.  Return to normal activities after rotator cuff repair repair normally takes 6 months on average. If rehab goes very well, may be able to do most activities at 4 months, except overhead or contact sports.  4. Physical Therapy: Begins 3-4 days after surgery, and proceed 1 time per week for the first 6 weeks, then 1-2 times per week from weeks 6-20 post-op.  5. Medications:  - You will be provided a prescription for narcotic pain medicine. After surgery, take 1-2 narcotic tablets every 4 hours if needed for severe pain.  - A prescription for anti-nausea medication will be provided in case the narcotic medicine causes nausea - take 1 tablet every 6 hours only if nauseated.   - Take tylenol 1000 mg (2 Extra Strength tablets or 3 regular strength) every 8 hours for pain.  May decrease or stop tylenol 5 days after surgery if you are having minimal pain. - Take ASA '325mg'$ /day x 2 weeks to help prevent DVTs/PEs (blood clots).  - DO NOT take ANY nonsteroidal anti-inflammatory pain medications (Advil, Motrin, Ibuprofen, Aleve,  Naproxen, or Naprosyn). These medicines can inhibit healing of your shoulder repair.  -May take the following starting the day after surgery to prevent constipation: 1.  Take Colace 100 mg twice daily 2.  Take 2 tablets of Senokot daily 3.  Take 1 capful of MiraLAX powder daily  If you are taking prescription medication for anxiety, depression, insomnia, muscle spasm, chronic pain, or for attention deficit disorder, you are advised that you are at a higher risk of adverse effects with use of narcotics post-op, including narcotic addiction/dependence, depressed breathing, death. If you use non-prescribed substances: alcohol, marijuana, cocaine, heroin, methamphetamines, etc., you are at a higher risk of adverse effects with use of narcotics post-op, including narcotic addiction/dependence, depressed breathing, death. You are advised that taking > 50 morphine milligram equivalents (MME) of narcotic pain medication per day results in twice the risk of overdose or death. For your prescription provided: oxycodone 5 mg - taking more than 6 tablets per day would result in > 50 morphine milligram equivalents (MME) of narcotic pain medication. Be advised that we will prescribe narcotics short-term, for acute post-operative pain only - 3 weeks for major operations such as shoulder repair/reconstruction surgeries.     6. Post-Op Appointment:  Your first post-op appointment will be 10-14 days post-op.  7. Work or School: For most, but not all procedures, we advise staying out of work or school for at least 1 to 2 weeks in order to recover from the stress  of surgery and to allow time for healing.   If you need a work or school note this can be provided.   8. Smoking: If you are a smoker, you need to refrain from smoking in the postoperative period. The nicotine in cigarettes will inhibit healing of your shoulder repair and decrease the chance of successful repair. Similarly, nicotine containing products  (gum, patches) should be avoided.   Post-operative Brace: Apply and remove the brace you received as you were instructed to at the time of fitting and as described in detail as the brace's instructions for use indicate.  Wear the brace for the period of time prescribed by your physician.  The brace can be cleaned with soap and water and allowed to air dry only.  Should the brace result in increased pain, decreased feeling (numbness/tingling), increased swelling or an overall worsening of your medical condition, please contact your doctor immediately.  If an emergency situation occurs as a result of wearing the brace after normal business hours, please dial 911 and seek immediate medical attention.  Let your doctor know if you have any further questions about the brace issued to you. Refer to the shoulder sling instructions for use if you have any questions regarding the correct fit of your shoulder sling.  [place] [facility]oubleshooting: [phone]  Video that illustrates how to properly use a shoulder sling: "Instructions for Proper Use of an Orthopaedic Sling" [email or web address]         PERIPHERAL NERVE BLOCK PATIENT INFORMATION  Your surgeon has requested a peripheral nerve block for your surgery. This anesthetic technique provides excellent post-operative pain relief for you in a safe and effective manner. It will also help reduce the risk of nausea and vomiting and allow earlier discharge from the hospital.   The block is performed under sedation with ultrasound guidance prior to your procedure. Due to the sedation, your may or may not remember the block experience. The nerve block will begin to take effect anywhere from 5 to 30 minutes after being administered. You will be transported to the operating room from your surgery after the block is completed.   At the end of surgery, when the anesthesia wears off, you will notice a few things. Your may not be  able to move or feel the part of your body targeted by the nerve block. These are normal experiences, and they will disappear as the block wears off.  If you had an interscalene nerve block performed (which is common for shoulder surgery), your voice can be very hoarse and you may feel that you are not able to take as deep a breath as you did before surgery. Some patients may also notice a droopy eyelid on the affected side. These symptoms will resolve once the block wears off.  Pain control: The nerve block technique used is a single injection that can last anywhere from 1-3 days. The duration of the numbness can vary between individuals. After leaving the hospital, it is important that you begin to take your prescribed pain medication when you start to sense the nerve block wearing off. This will help you avoid unpleasant pain at the time the nerve block wears off, which can sometimes be in the middle of the night. The block will only cover pain in the areas targeted by the nerve block so if you experience surgical pain outside of that area, please take your prescribed pain medication. Management of the "numb area": After a nerve block, you cannot  feel pain, pressure, or temperature in the affected area so there is an increased risk for injury. You should take extra care to protect the affected areas until sensation and movement returns. Please take caution to not come in contact with extremely hot or cold items because you will not be able to sense or protect yourself form the extremes of temperature.  You may experience some persistent numbness after the procedure by most neurological deficits resolve over time and the incidence of serious long term neurological complications attributable to peripheral nerve blocks are relatively uncommon.    Information for Discharge Teaching: EXPAREL (bupivacaine liposome injectable suspension)   Your surgeon or anesthesiologist gave you EXPAREL(bupivacaine) to help  control your pain after surgery.  EXPAREL is a local anesthetic that provides pain relief by numbing the tissue around the surgical site. EXPAREL is designed to release pain medication over time and can control pain for up to 72 hours. Depending on how you respond to EXPAREL, you may require less pain medication during your recovery.  Possible side effects: Temporary loss of sensation or ability to move in the area where bupivacaine was injected. Nausea, vomiting, constipation Rarely, numbness and tingling in your mouth or lips, lightheadedness, or anxiety may occur. Call your doctor right away if you think you may be experiencing any of these sensations, or if you have other questions regarding possible side effects.  Follow all other discharge instructions given to you by your surgeon or nurse. Eat a healthy diet and drink plenty of water or other fluids.  If you return to the hospital for any reason within 96 hours following the administration of EXPAREL, it is important for health care providers to know that you have received this anesthetic. A teal colored band has been placed on your arm with the date, time and amount of EXPAREL you have received in order to alert and inform your health care providers. Please leave this armband in place for the full 96 hours following administration, and then you may remove the band.  POLAR CARE INFORMATION  [email or web address]  How to use [facility] Cold Therapy System?  YouTube   [email or web address]  OPERATING INSTRUCTIONS  Start the product With dry hands, connect the transformer to the electrical connection located on the top of the cooler. Next, plug the transformer into an appropriate electrical outlet. The unit will automatically start running at this point.  To stop the pump, disconnect electrical power.  Unplug to stop the product when not in use. Unplugging the Polar Care unit turns it off. Always unplug  immediately after use. Never leave it plugged in while unattended. Remove pad.    FIRST ADD WATER TO FILL LINE, THEN ICE---Replace ice when existing ice is almost melted  1 Discuss Treatment with your [facility]ger Practitioner and Use Only as Prescribed 2 Apply Insulation Barrier & Cold Therapy Pad 3 Check for Moisture 4 Inspect Skin Regularly  Tips and Trouble Shooting Usage Tips 1. Use cubed or chunked ice for optimal performance. 2. It is recommended to drain the Pad between uses. To drain the pad, hold the Pad upright with the hose pointed toward the ground. Depress the black plunger and allow water to drain out. 3. You may disconnect the Pad from the unit without removing the pad from the affected area by depressing the silver tabs on the hose coupling and gently pulling the hoses apart. The Pad and unit will seal itself and will not leak. Note: Some dripping  during release is normal. 4. DO NOT RUN PUMP WITHOUT WATER! The pump in this unit is designed to run with water. Running the unit without water will cause permanent damage to the pump. 5. Unplug unit before removing lid.  TROUBLESHOOTING GUIDE Pump not running, Water not flowing to the pad, Pad is not getting cold 1. Make sure the transformer is plugged into the wall outlet. 2. Confirm that the ice and water are filled to the indicated levels. 3. Make sure there are no kinks in the pad. 4. Gently pull on the blue tube to make sure the tube/pad junction is straight. 5. Remove the pad from the treatment site and ll it while the pad is lying at; then reapply. 6. Confirm that the pad couplings are securely attached to the unit. Listen for the double clicks (Figure 1) to confirm the pad couplings are securely attached.  Leaks    Note: Some condensation on the lines, controller, and pads is unavoidable, especially in warmer climates. 1. If using a [employer] Polar Care Cold Therapy unit with a detachable Cold Therapy Pad, and a leak  exists (other than condensation on the lines) disconnect the pad couplings. Make sure the silver tabs on the couplings are depressed before reconnecting the pad to the pump hose; then confirm both sides of the coupling are properly clicked in. 2. If the coupling continues to leak or a leak is detected in the pad itself, stop using it and call [place] at [phone].  Cleaning After use, empty and dry the unit with a soft cloth. Warm water and mild detergent may be used occasionally to clean the pump and tubes.  WARNING: The [place] can be cold enough to cause serious injury, including full skin necrosis. Follow these Operating Instructions, and carefully read the Product Insert (see pouch on side of unit) and the Cold Therapy Pad Fitting Instructions (provided with each Cold Therapy Pad) prior to use.

## 2021-12-26 NOTE — Progress Notes (Signed)
Assisted [facility] Page ANMD with right, interscalene , ultrasound guided block. Side rails up, monitors on throughout procedure. See vital signs in flow sheet. Tolerated Procedure well.

## 2021-12-26 NOTE — H&P (Signed)
Paper H&P to be scanned into permanent record. H&P reviewed. No significant changes noted.  

## 2021-12-26 NOTE — Anesthesia Postprocedure Evaluation (Signed)
Anesthesia Post Note  Patient: EITHEN CASTIGLIA  Procedure(s) Performed: Right shoulder arthroscopic supraspinatus and subscapularis repair, subacromial decompression, distal clavicle excision, and biceps tenodesis (Right: Shoulder)     Patient location during evaluation: PACU Anesthesia Type: Regional Level of consciousness: awake and alert Pain management: pain level controlled Vital Signs Assessment: post-procedure vital signs reviewed and stable Respiratory status: spontaneous breathing, nonlabored ventilation, respiratory function stable and patient connected to nasal cannula oxygen Cardiovascular status: blood pressure returned to baseline and stable Postop Assessment: no apparent nausea or vomiting Anesthetic complications: no   There were no known notable events for this encounter.  Adele Barthel Xiomara Sevillano

## 2022-01-01 NOTE — Addendum Note (Signed)
Addendum  created 01/01/22 2836 by Ester Rink, CRNA   [employer] properties accepted

## 2022-03-05 ENCOUNTER — Other Ambulatory Visit: Payer: Self-pay | Admitting: Internal Medicine

## 2022-03-05 DIAGNOSIS — I447 Left bundle-branch block, unspecified: Secondary | ICD-10-CM

## 2022-03-05 DIAGNOSIS — R079 Chest pain, unspecified: Secondary | ICD-10-CM

## 2022-03-05 DIAGNOSIS — R9431 Abnormal electrocardiogram [ECG] [EKG]: Secondary | ICD-10-CM

## 2022-03-18 ENCOUNTER — Telehealth (HOSPITAL_COMMUNITY): Payer: Self-pay | Admitting: *Deleted

## 2022-03-18 ENCOUNTER — Encounter (HOSPITAL_COMMUNITY): Payer: Self-pay

## 2022-03-18 MED ORDER — IVABRADINE HCL 5 MG PO TABS: ORAL_TABLET | ORAL | 0 refills | Status: DC

## 2022-03-18 MED ORDER — METOPROLOL TARTRATE 100 MG PO TABS: ORAL_TABLET | ORAL | 0 refills | Status: DC

## 2022-03-18 NOTE — Telephone Encounter (Signed)
error 

## 2022-03-18 NOTE — Telephone Encounter (Signed)
Reaching out to patient to offer assistance regarding upcoming cardiac imaging study; pt verbalizes understanding of appt date/time, parking situation and where to check in,  medications ordered, and verified current allergies; name and call back number provided for further questions should they arise  Gordy Clement RN Navigator Cardiac Imaging [facility] [phone] office [phone] cell  Patient to take '100mg'$  metoprolol tartrate and '10mg'$  ivabradine two hours prior to his cardiac CT scan.  He will hold his morning propanolol.

## 2022-03-19 ENCOUNTER — Ambulatory Visit
Admission: RE | Admit: 2022-03-19 | Discharge: 2022-03-19 | Disposition: A | Discharge status: Home / Self Care | Payer: Medicare Other | Source: Ambulatory Visit | Attending: Internal Medicine | Admitting: Internal Medicine

## 2022-03-19 DIAGNOSIS — R079 Chest pain, unspecified: Secondary | ICD-10-CM | POA: Insufficient documentation

## 2022-03-19 DIAGNOSIS — I447 Left bundle-branch block, unspecified: Secondary | ICD-10-CM | POA: Diagnosis not present

## 2022-03-19 DIAGNOSIS — R9431 Abnormal electrocardiogram [ECG] [EKG]: Secondary | ICD-10-CM | POA: Insufficient documentation

## 2022-03-19 MED ORDER — IOHEXOL 350 MG/ML SOLN
75.0000 mL | Freq: Once | INTRAVENOUS | Status: AC | PRN
  Administered 2022-03-19: 75 mL via INTRAVENOUS

## 2022-03-19 MED ORDER — NITROGLYCERIN 0.4 MG SL SUBL
0.8000 mg | SUBLINGUAL_TABLET | Freq: Once | SUBLINGUAL | Status: AC
  Administered 2022-03-19: 0.8 mg via SUBLINGUAL

## 2022-03-19 MED ORDER — METOPROLOL TARTRATE 5 MG/5ML IV SOLN
10.0000 mg | Freq: Once | INTRAVENOUS | Status: AC
  Administered 2022-03-19: 10 mg via INTRAVENOUS

## 2022-03-19 NOTE — Progress Notes (Signed)
Patient tolerated procedure well. Ambulate w/o difficulty. Denies light headedness or being dizzy. Sitting in chair drinking water provided. Encouraged to drink extra water today and reasoning explained. Verbalized understanding. All questions answered. ABC intact. No further needs. Discharge from procedure area w/o issues.   °

## 2022-07-22 ENCOUNTER — Other Ambulatory Visit: Payer: Self-pay | Admitting: Orthopedic Surgery

## 2022-07-22 DIAGNOSIS — G8929 Other chronic pain: Secondary | ICD-10-CM

## 2022-07-22 DIAGNOSIS — M5430 Sciatica, unspecified side: Secondary | ICD-10-CM

## 2022-08-03 ENCOUNTER — Ambulatory Visit
Admission: RE | Admit: 2022-08-03 | Discharge: 2022-08-03 | Disposition: A | Discharge status: Home / Self Care | Payer: Medicare Other | Source: Ambulatory Visit | Attending: Family Medicine | Admitting: Family Medicine

## 2022-08-03 DIAGNOSIS — Z87891 Personal history of nicotine dependence: Secondary | ICD-10-CM | POA: Insufficient documentation

## 2022-08-03 DIAGNOSIS — Z122 Encounter for screening for malignant neoplasm of respiratory organs: Secondary | ICD-10-CM | POA: Diagnosis present

## 2022-08-05 ENCOUNTER — Encounter: Payer: Self-pay | Admitting: Orthopedic Surgery

## 2022-08-05 ENCOUNTER — Other Ambulatory Visit: Payer: Self-pay

## 2022-08-05 DIAGNOSIS — Z122 Encounter for screening for malignant neoplasm of respiratory organs: Secondary | ICD-10-CM

## 2022-08-05 DIAGNOSIS — Z87891 Personal history of nicotine dependence: Secondary | ICD-10-CM

## 2022-08-12 ENCOUNTER — Ambulatory Visit
Admission: RE | Admit: 2022-08-12 | Discharge: 2022-08-12 | Disposition: A | Discharge status: Home / Self Care | Payer: Medicare Other | Source: Ambulatory Visit | Attending: Orthopedic Surgery | Admitting: Orthopedic Surgery

## 2022-08-12 DIAGNOSIS — M5430 Sciatica, unspecified side: Secondary | ICD-10-CM

## 2022-08-12 DIAGNOSIS — G8929 Other chronic pain: Secondary | ICD-10-CM

## 2022-09-02 ENCOUNTER — Telehealth: Payer: Self-pay | Admitting: Acute Care

## 2022-09-02 NOTE — Telephone Encounter (Signed)
Spoke with pt regarding a bill he received for $20.40 for his recent lung screening CT. I advised that CT was coded as a screening CT and that he should contact his insurance to ask about this charge. Pt verbalized understanding. Nothing further needed at this time.

## 2022-10-10 IMAGING — CT CT CHEST LUNG CANCER SCREENING LOW DOSE W/O CM
2 of 5 series · 15 of 40 positions shown, 18 images · non-contrast
Comparison: Low-dose lung cancer screening chest CT 03/15/2019.

CLINICAL DATA: 64-year-old male former smoker (quit 9 years ago)
with 35 pack-year history of smoking. Lung cancer screening
examination.

EXAM:
CT CHEST WITHOUT CONTRAST LOW-DOSE FOR LUNG CANCER SCREENING
TECHNIQUE: Multidetector CT imaging of the chest was performed following the
standard protocol without IV contrast.

[Series 3: lung 1.00 · axial · 0.81mm/px · z∈[-1272,-929]mm · 12 of 381 slices shown, 15 images]
[im 19/381  mediastinal]
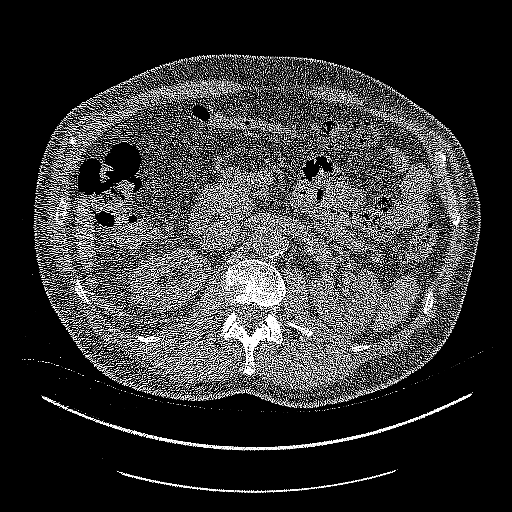
[im 19/381  lung]
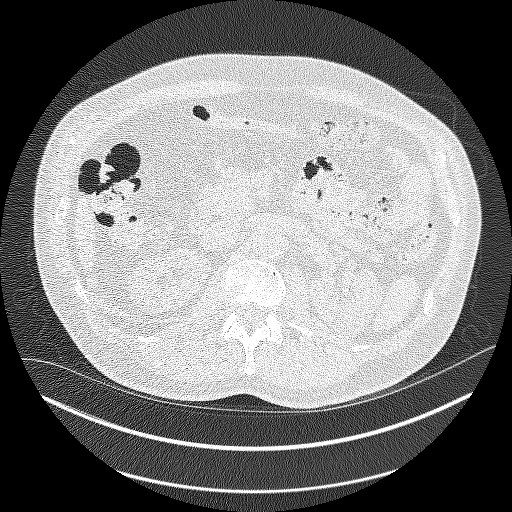
[im 55/381  lung]
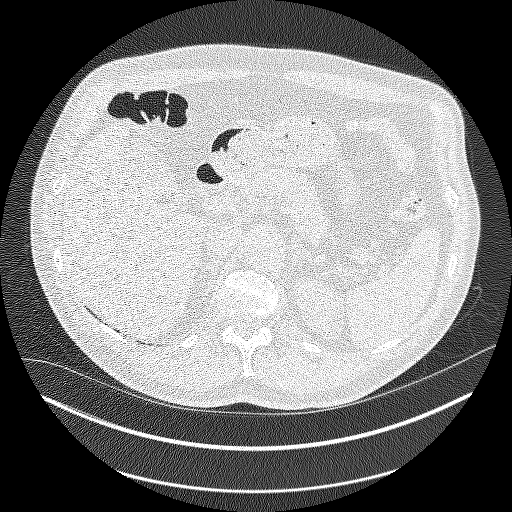
[im 91/381  lung]
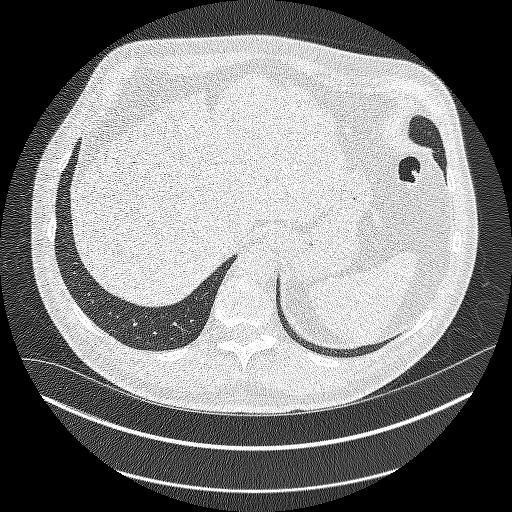
[im 109/381  lung]
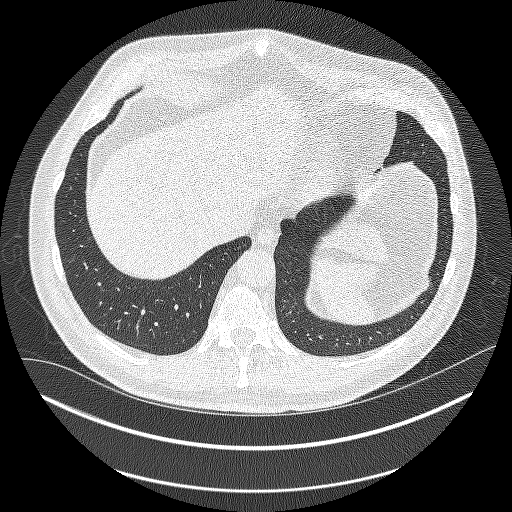
[im 145/381  mediastinal]
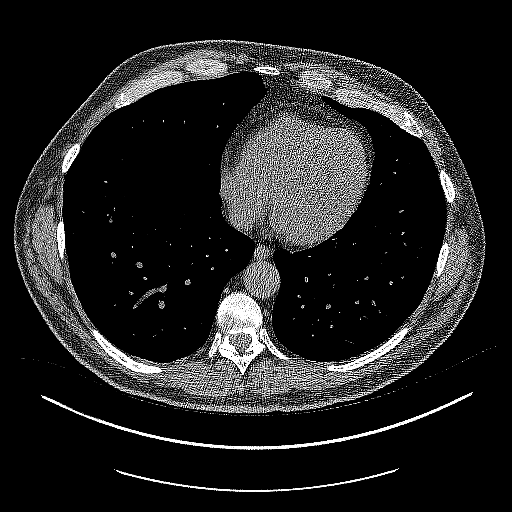
[im 145/381  lung]
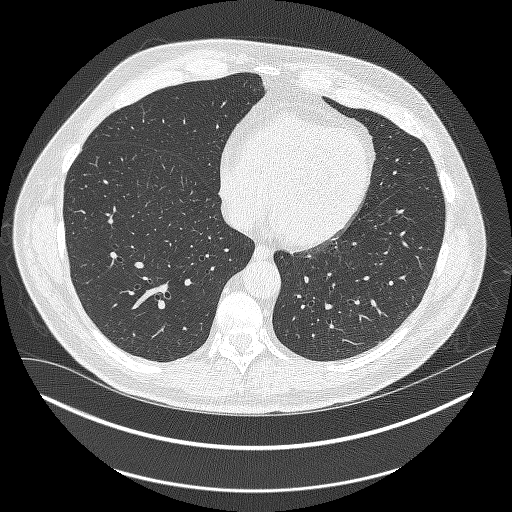
[im 181/381  lung]
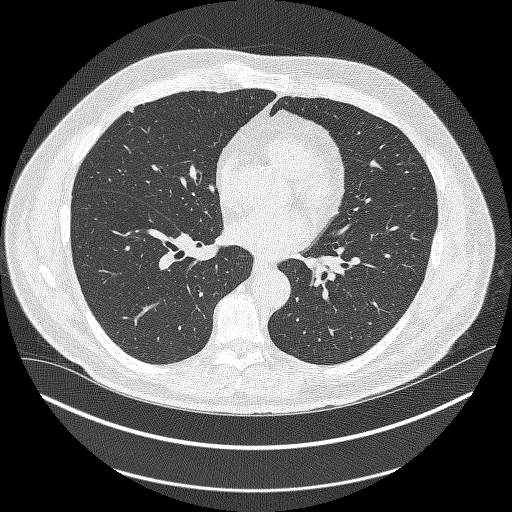
[im 200/381  lung]
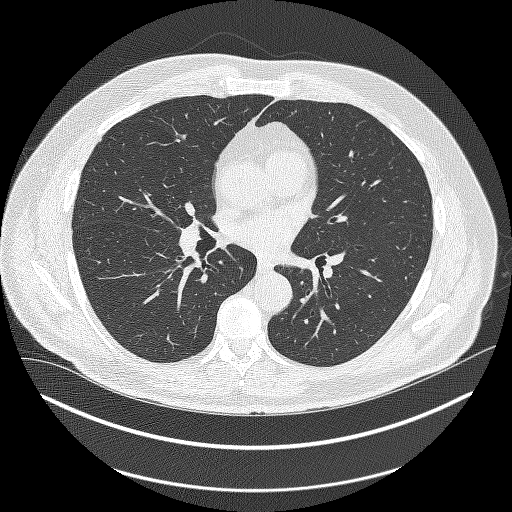
[im 236/381  lung]
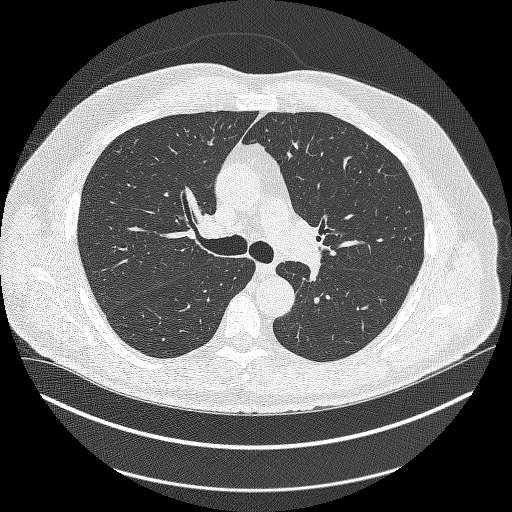
[im 272/381  mediastinal]
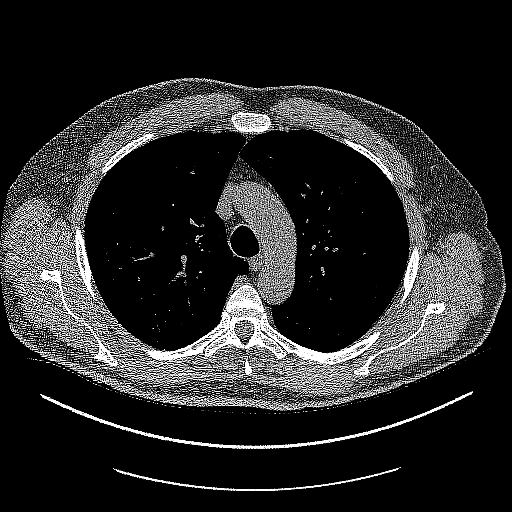
[im 272/381  lung]
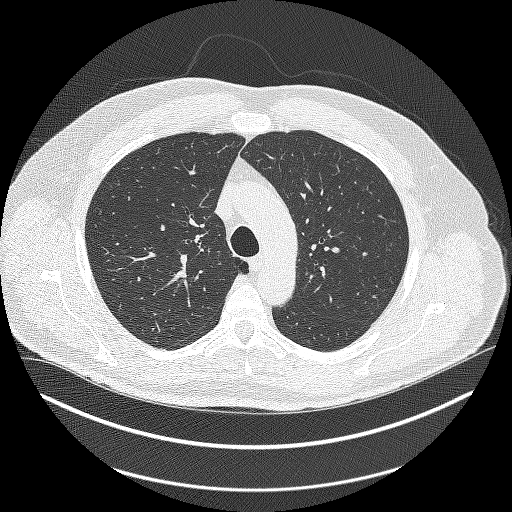
[im 290/381  lung]
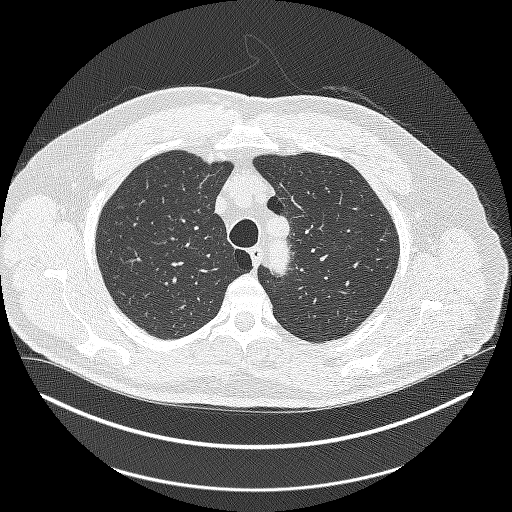
[im 326/381  lung]
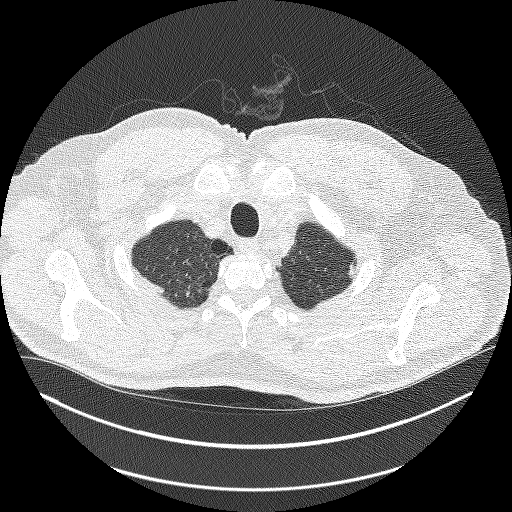
[im 362/381  lung]
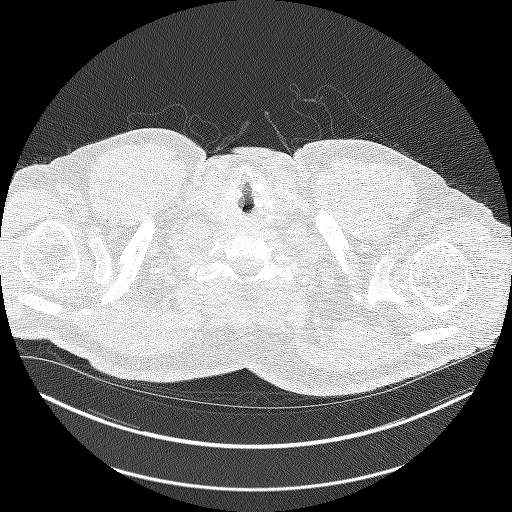

[Series 4: coronals lung 1.00 cor · coronal · 0.74mm/px · 3 of 400 slices shown]
[im 80/400  lung]
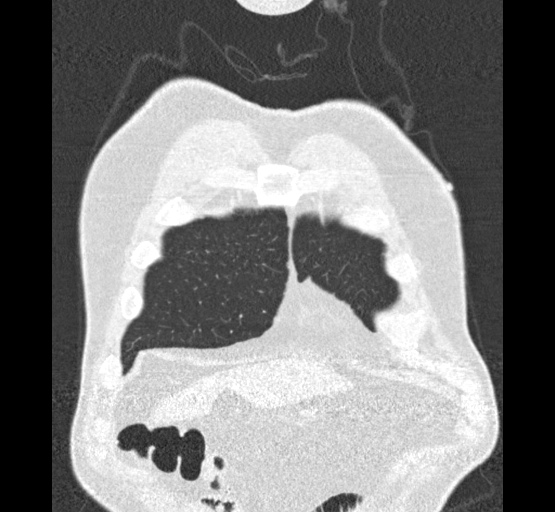
[im 160/400  lung]
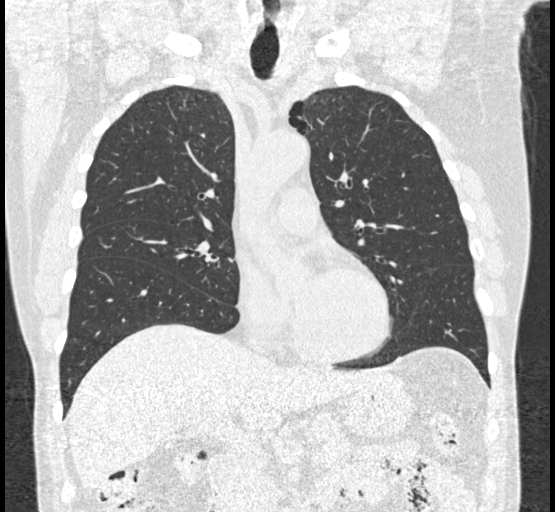
[im 240/400  lung]
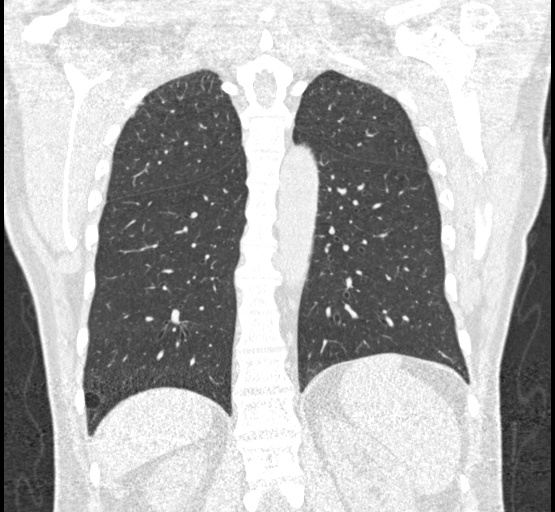

[15 of 40 positions shown; findings below may reference images not displayed]

FINDINGS: Cardiovascular: Heart size is normal. There is no significant
pericardial fluid, thickening or pericardial calcification. Aortic
atherosclerosis. No definite coronary artery calcifications.

Mediastinum/Nodes: No pathologically enlarged mediastinal or hilar
lymph nodes. Please note that accurate exclusion of hilar adenopathy
is limited on noncontrast CT scans. Esophagus is unremarkable in
appearance. No axillary lymphadenopathy.

Lungs/Pleura: Multiple small pulmonary nodules are again noted
throughout the lungs bilaterally, largest of which is again in the
apex of the right upper lobe (axial image 52 of series 3), with a
volume derived mean diameter of 11.5 mm, similar to prior studies.
No other larger more suspicious appearing pulmonary nodules or
masses are noted. No acute consolidative airspace disease. No
pleural effusions. Diffuse bronchial wall thickening with mild
centrilobular and paraseptal emphysema.

Upper Abdomen: Aortic atherosclerosis.

Musculoskeletal: There are no aggressive appearing lytic or blastic
lesions noted in the visualized portions of the skeleton.
IMPRESSION: 1. Lung-RADS 2, benign appearance or behavior. Continue annual
screening with low-dose chest CT without contrast in 12 months.
2. Aortic atherosclerosis.
3. Mild diffuse bronchial wall thickening with mild centrilobular
and paraseptal emphysema; imaging findings suggestive of underlying
COPD.

Aortic Atherosclerosis (8G4SO-[id number].C) and Emphysema (8G4SO-PA8.S).

## 2022-11-17 ENCOUNTER — Encounter: Payer: Self-pay | Admitting: Student in an Organized Health Care Education/Training Program

## 2022-11-17 ENCOUNTER — Ambulatory Visit
Payer: Medicare Other | Attending: Student in an Organized Health Care Education/Training Program | Admitting: Student in an Organized Health Care Education/Training Program

## 2022-11-17 VITALS — BP 139/93 | HR 106 | Temp 97.7°F | Resp 16 | Ht 75.0 in | Wt 203.0 lb

## 2022-11-17 DIAGNOSIS — G894 Chronic pain syndrome: Secondary | ICD-10-CM | POA: Insufficient documentation

## 2022-11-17 DIAGNOSIS — M5116 Intervertebral disc disorders with radiculopathy, lumbar region: Secondary | ICD-10-CM | POA: Diagnosis present

## 2022-11-17 DIAGNOSIS — M5416 Radiculopathy, lumbar region: Secondary | ICD-10-CM | POA: Diagnosis not present

## 2022-11-17 DIAGNOSIS — G8929 Other chronic pain: Secondary | ICD-10-CM | POA: Insufficient documentation

## 2022-11-17 NOTE — Progress Notes (Signed)
Patient: Bradley Barker  Service Category: E/M  Provider: Edward Jolly, MD  DOB: August 25, 1955  DOS: 11/17/2022  Referring Provider: Wendall Stade  MRN: [id number]  Setting: Ambulatory outpatient  PCP: Dorothey Baseman, MD  Type: New Patient  Specialty: Interventional Pain Management    Location: Office  Delivery: Face-to-face     Primary Reason(s) for Visit: Encounter for initial evaluation of one or more chronic problems (new to examiner) potentially causing chronic pain, and posing a threat to normal musculoskeletal function. (Level of risk: High) CC: Back Pain (Lumbar right is worse since September.  Steroid taper did not help )  HPI  Bradley Barker is a 67 y.o. year old, male patient, who comes for the first time to our practice referred by Dedra Skeens, PA-C for our initial evaluation of his chronic pain. He has Cervical radiculitis; DDD (degenerative disc disease), cervical; Duodenal ulcer; Family history of colonic polyps; Gastritis and duodenitis; Muscle spasms of neck; Abnormal stress ECG; Preop cardiovascular exam; Personal history of tobacco use, presenting hazards to health; Chronic radicular lumbar pain; Lumbar disc herniation with radiculopathy; and Chronic pain syndrome on their problem list. Today he comes in for evaluation of his Back Pain (Lumbar right is worse since September.  Steroid taper did not help )  Pain Assessment: Location: Right, Lower Back Radiating: down right leg to the foot, sometimes just aches in the right shin Onset: More than a month ago Duration: Chronic pain Quality: Constant, Discomfort, Aching, Shooting, Radiating Severity: 2 /10 (subjective, self-reported pain score)  Effect on ADL: working outside sometimes the pain will go away and sometimes it is painful Timing: Constant Modifying factors: frequent position changes.  gabapentin, tylenol #3.  Aleve with tylenol 500 mg in the a.m. BP: (!) 139/93  HR: (!) 106  Onset and Duration: Gradual and Present longer  than 3 months Cause of pain:  sleepiing in awkward position due to shoulder surgery Severity: No change since onset, NAS-11 at its worse: 8/10, NAS-11 at its best: 3/10, NAS-11 now: 4/10, and NAS-11 on the average: 6/10 Timing: Not influenced by the time of the day, After activity or exercise, and After a period of immobility Aggravating Factors: Bending, Kneeling, Lifiting, Prolonged sitting, Prolonged standing, Stooping , and Twisting Alleviating Factors: Stretching, Hot packs, Medications, Standing, and Using a brace Associated Problems: Fatigue, Pain that wakes patient up, and Pain that does not allow patient to sleep Quality of Pain: Aching, Annoying, Intermittent, Dull, Nagging, Pulsating, Sharp, Throbbing, and Uncomfortable Previous Examinations or Tests: MRI scan and X-rays Previous Treatments: Steroid treatments by mouth and Stretching exercises  Bradley Barker is being evaluated for possible interventional pain management therapies for the treatment of his chronic pain.   Low back pain with radiation to right leg down to right toes, started approx 10 months ago Started after his shoulder surgery (right), December 26, 2021. States that his low back and right leg pain became very prominent around October Denies any weakness in right leg. Has been doing home direct PT exercises and stretches by Dedra Skeens Has done Prednisone taper that helped for a few days Has also tried back brace He is referred here for lumbar spinal injection. He has tried various multimodal analgesics including Skelaxin, Robaxin, oxycodone, acetaminophen.  He is currently on amitriptyline 25 mg nightly and gabapentin 100 mg nightly.  Bradley Barker has been informed that this initial visit was an evaluation only.  On the follow up appointment I will go over the results, including ordered  tests and available interventional therapies. At that time he will have the opportunity to decide whether to proceed with offered therapies or  not. In the event that Bradley Barker prefe Meds   Current Outpatient Medications:    amitriptyline (ELAVIL) 25 MG tablet, Take 25 mg by mouth at bedtime. , Disp: , Rfl:    Arginine 500 MG CAPS, Take by mouth daily., Disp: , Rfl:    fexofenadine (ALLEGRA) 180 MG tablet, Take 180 mg by mouth daily as needed., Disp: , Rfl:    gabapentin (NEURONTIN) 100 MG capsule, Take 100 mg by mouth at bedtime as needed., Disp: , Rfl:    LYSINE PO, Take by mouth daily., Disp: , Rfl:    Omega-3 1000 MG CAPS, Take by mouth., Disp: , Rfl:    propranolol (INDERAL) 40 MG tablet, Take 40 mg by mouth 2 (two) times daily. , Disp: , Rfl:    tadalafil (ADCIRCA/CIALIS) 20 MG tablet, Take 20 mg by mouth daily as needed for erectile dysfunction., Disp: , Rfl:   Imaging Review   MR SHOULDER RIGHT WO CONTRAST  Narrative CLINICAL DATA:  Right shoulder pain and decreased range of motion for 2 months.  EXAM: MRI OF THE RIGHT SHOULDER WITHOUT CONTRAST  TECHNIQUE: Multiplanar, multisequence MR imaging of the shoulder was performed. No intravenous contrast was administered.  COMPARISON:  None Available.  FINDINGS: The exam is degraded by patient motion.  Rotator cuff: Supraspinatus and infraspinatus tendinopathy is seen. A full-thickness tear of the supraspinatus measures approximately 2.2 cm from front to back. Retraction is 1-2 cm. The rotator cuff is otherwise intact.  Muscles:  No atrophy or focal lesion.  Biceps long head:  Intact.  Acromioclavicular Joint: Moderate osteoarthritis. Type 1 acromion. There is fluid in the subacromial/subdeltoid bursa.  Glenohumeral Joint: Negative.  Labrum:  Intact.  Bones:  Negative  Other: None.  IMPRESSION: Near complete supraspinatus tendon tear with 1-2 cm of retraction. No atrophy.  Moderate acromioclavicular osteoarthritis.  Subacromial/subdeltoid fluid compatible with bursitis.   Electronically Signed By: Drusilla Kanner M.D. On: 11/22/2021  11:26  MR LUMBAR SPINE WO CONTRAST  Narrative CLINICAL DATA:  Provided history: Chronic right-sided low back pain, unspecified whether sciatica present. Sciatica, unspecified laterality. Additional history provided by the scanning technologist: The patient reports back pain, pain radiating into the right leg/foot.  EXAM: MRI LUMBAR SPINE WITHOUT CONTRAST  TECHNIQUE: Multiplanar, multisequence MR imaging of the lumbar spine was performed. No intravenous contrast was administered.  COMPARISON:  No pertinent prior exams available for comparison.  FINDINGS: Segmentation: Five lumbar vertebrae are assumed and the caudal-most well-formed intervertebral disc space is designated L5-S1.  Alignment: Levocurvature of the lumbar spine. No significant spondylolisthesis.  Vertebrae: Vertebral body height is maintained. No significant marrow edema or focal suspicious osseous lesion.  Conus medullaris and cauda equina: Conus extends to the L1 level. No signal abnormality identified within the visualized distal spinal cord.  Paraspinal and other soft tissues: No acute abnormality identified within included portions of the abdomen/retroperitoneum. No paraspinal mass or collection.  Disc levels:  Mild disc degeneration, greatest at L3-L4 and L4-L5.  T12-L1: No significant disc herniation or stenosis.  L1-L2: No significant disc herniation or stenosis.  L2-L3: Annular fissure along the left ventral aspect of the disc (series 19, image 25). Mild facet arthrosis. No significant disc herniation or stenosis.  L3-L4: Annular fissure along the left ventral aspect of the disc. Disc bulge. Superimposed shallow broad-based central disc protrusion (at site of posterior annular fissure).  Mild facet arthrosis (greater on the right) with small right facet joint effusion. Mild ligamentum flavum hypertrophy on the right. Mild bilateral subarticular narrowing (without frank nerve root  impingement). No significant central canal stenosis. Mild right neural foraminal narrowing.  L4-L5: Disc bulge. Mild facet arthrosis and ligamentum flavum hypertrophy. Mild bilateral subarticular narrowing (without nerve root impingement). No significant central canal stenosis. Mild to moderate right neural foraminal narrowing.  L5-S1: Small disc bulge. Mild facet arthrosis. No significant spinal canal or foraminal stenosis.  IMPRESSION: 1. Lumbar spondylosis, as outlined and with findings most notably as follows 2. At L3-L4, there is a disc bulge. Superimposed shallow broad-based central disc protrusion (at site of posterior annular fissure). Annular fissure also present along the left ventral aspect of the disc. Mild facet arthrosis with small right facet joint effusion. Mild ligamentum flavum hypertrophy on the right. Mild bilateral subarticular narrowing (without frank nerve root impingement). Mild right neural foraminal narrowing. 3. At L4-L5, there is multifactorial mild bilateral subarticular narrowing (without nerve root impingement) and mild-to-moderate right neural foraminal narrowing. 4. No significant spinal canal or foraminal stenosis at the remaining levels. 5. Levocurvature of the lumbar spine.   Electronically Signed By: Jackey Loge D.O. On: 08/12/2022 11:32   Complexity Note: Imaging results reviewed.                         ROS  Cardiovascular: Chest pain Pulmonary or Respiratory: No reported pulmonary signs or symptoms such as wheezing and difficulty taking a deep full breath (Asthma), difficulty blowing air out (Emphysema), coughing up mucus (Bronchitis), persistent dry cough, or temporary stoppage of breathing during sleep Neurological: Curved spine Psychological-Psychiatric: Prone to panicking Gastrointestinal: Vomiting blood (Ulcers) and Reflux or heatburn Genitourinary: No reported renal or genitourinary signs or symptoms such as difficulty voiding  or producing urine, peeing blood, non-functioning kidney, kidney stones, difficulty emptying the bladder, difficulty controlling the flow of urine, or chronic kidney disease Hematological: No reported hematological signs or symptoms such as prolonged bleeding, low or poor functioning platelets, bruising or bleeding easily, hereditary bleeding problems, low energy levels due to low hemoglobin or being anemic Endocrine: No reported endocrine signs or symptoms such as high or low blood sugar, rapid heart rate due to high thyroid levels, obesity or weight gain due to slow thyroid or thyroid disease Rheumatologic: No reported rheumatological signs and symptoms such as fatigue, joint pain, tenderness, swelling, redness, heat, stiffness, decreased range of motion, with or without associated rash Musculoskeletal: Negative for myasthenia gravis, muscular dystrophy, multiple sclerosis or malignant hyperthermia Work History: Retired  Allergies  Bradley Barker is allergic to cyclobenzaprine, omeprazole, other, and tramadol.  Laboratory Chemistry Profile   Renal Lab Results  Component Value Date   BUN 18 01/23/2016   CREATININE 0.87 01/23/2016   GFRAA >60 01/23/2016   GFRNONAA >60 01/23/2016   PROTEINUR NEGATIVE 01/23/2016     Electrolytes Lab Results  Component Value Date   NA 138 01/23/2016   K 3.8 01/23/2016   CL 102 01/23/2016   CALCIUM 10.1 01/23/2016     Hepatic Lab Results  Component Value Date   AST 16 01/23/2016   ALT 22 01/23/2016   ALBUMIN 4.5 01/23/2016   ALKPHOS 82 01/23/2016     ID No results found for: "LYMEIGGIGMAB", "HIV", "SARSCOV2NAA", "STAPHAUREUS", "MRSAPCR", "HCVAB", "PREGTESTUR", "RMSFIGG", "QFVRPH1IGG", "QFVRPH2IGG"   Bone No results found for: "VD25OH", "VD125OH2TOT", "LK4401UU7", "OZ3664QI3", "25OHVITD1", "25OHVITD2", "25OHVITD3", "TESTOFREE", "TESTOSTERONE"   Endocrine Lab Results  Component Value Date   GLUCOSE 99 01/23/2016   GLUCOSEU NEGATIVE 01/23/2016      Neuropathy No results found for: "VITAMINB12", "FOLATE", "HGBA1C", "HIV"   CNS No results found for: "COLORCSF", "APPEARCSF", "RBCCOUNTCSF", "WBCCSF", "POLYSCSF", "LYMPHSCSF", "EOSCSF", "PROTEINCSF", "GLUCCSF", "JCVIRUS", "CSFOLI", "IGGCSF", "LABACHR", "ACETBL"   Inflammation (CRP: Acute  ESR: Chronic) Lab Results  Component Value Date   ESRSEDRATE 3 03/30/2013     Rheumatology No results found for: "RF", "ANA", "LABURIC", "URICUR", "LYMEIGGIGMAB", "LYMEABIGMQN", "HLAB27"   Coagulation Lab Results  Component Value Date   PLT 220 01/23/2016     Cardiovascular Lab Results  Component Value Date   HGB 15.6 01/23/2016   HCT 45.7 01/23/2016     Screening No results found for: "SARSCOV2NAA", "COVIDSOURCE", "STAPHAUREUS", "MRSAPCR", "HCVAB", "HIV", "PREGTESTUR"   Cancer No results found for: "CEA", "CA125", "LABCA2"   Allergens No results found for: "ALMOND", "APPLE", "ASPARAGUS", "AVOCADO", "BANANA", "BARLEY", "BASIL", "BAYLEAF", "GREENBEAN", "LIMABEAN", "WHITEBEAN", "BEEFIGE", "REDBEET", "BLUEBERRY", "BROCCOLI", "CABBAGE", "MELON", "CARROT", "CASEIN", "CASHEWNUT", "CAULIFLOWER", "CELERY"     Note: Lab results reviewed.  PFSH  Drug: Bradley Barker  reports no history of drug use. Alcohol:  reports current alcohol use. Tobacco:  reports that he quit smoking about 11 years ago. His smoking use included cigarettes. He started smoking about 46 years ago. He has a 35 pack-year smoking history. He has never used smokeless tobacco. Medical:  has a past medical history of Allergic rhinitis, Anxiety, Arthritis, Complication of anesthesia, Depression, Duodenal ulcer, Dysplastic nevus (03/14/2015), Dysrhythmia, Family history of colonic polyps, Gastritis and duodenitis, GERD (gastroesophageal reflux disease), Hemorrhoids, Orbit fracture, left (HCC), Palpitations, Sciatica of right side, and Tachycardia. Family: family history includes Diabetes in his brother; Heart attack in his father; Heart  disease in his father; Kidney cancer in his mother; Ovarian cancer in his sister.  Past Surgical History:  Procedure Laterality Date   CARDIAC CATHETERIZATION     CARPAL TUNNEL RELEASE     COLONOSCOPY WITH PROPOFOL N/A 03/04/2018   Procedure: COLONOSCOPY WITH PROPOFOL;  Surgeon: Scot Jun, MD;  Location: [facility] ENDOSCOPY;  Service: Endoscopy;  Laterality: N/A;   FRACTURE SURGERY Left    2007   HERNIA REPAIR     INGUINAL HERNIA REPAIR Bilateral 02/13/2016   Procedure: LAPAROSCOPIC BILATERAL INGUINAL HERNIA REPAIR;  Surgeon: Gladis Riffle, MD;  Location: [facility] ORS;  Service: General;  Laterality: Bilateral;   INSERTION OF MESH N/A 02/13/2016   Procedure: INSERTION OF MESH;  Surgeon: Gladis Riffle, MD;  Location: [facility] ORS;  Service: General;  Laterality: N/A;   KNEE ARTHROSCOPY Bilateral    2002, 2005   TIBIA FRACTURE SURGERY Left    VASECTOMY     Active Ambulatory Problems    Diagnosis Date Noted   Cervical radiculitis 10/20/2013   DDD (degenerative disc disease), cervical 10/20/2013   Duodenal ulcer 10/23/2013   Family history of colonic polyps 10/23/2013   Gastritis and duodenitis 10/23/2013   Muscle spasms of neck 10/20/2013   Abnormal stress ECG 02/12/2016   Preop cardiovascular exam 02/12/2016   Personal history of tobacco use, presenting hazards to health 03/03/2016   Chronic radicular lumbar pain 11/17/2022   Lumbar disc herniation with radiculopathy 11/17/2022   Chronic pain syndrome 11/17/2022   Resolved Ambulatory Problems    Diagnosis Date Noted   Bilateral recurrent inguinal hernia with obstruction and without gangrene    Past Medical History:  Diagnosis Date   Allergic rhinitis    Anxiety    Arthritis    Complication  of anesthesia    Depression    Dysplastic nevus 03/14/2015   Dysrhythmia    GERD (gastroesophageal reflux disease)    Hemorrhoids    Orbit fracture, left (HCC)    Palpitations    Sciatica of right side    Tachycardia     Constitutional Exam  General appearance: Well nourished, well developed, and well hydrated. In no apparent acute distress Vitals:   11/17/22 1051  BP: (!) 139/93  Pulse: (!) 106  Resp: 16  Temp: 97.7 F (36.5 C)  TempSrc: Temporal  SpO2: 95%  Weight: 203 lb (92.1 kg)  Height: 6\' 3"  (1.905 m)   BMI Assessment: Estimated body mass index is 25.37 kg/m as calculated from the following:   Height as of this encounter: 6\' 3"  (1.905 m).   Weight as of this encounter: 203 lb (92.1 kg).  BMI interpretation table: BMI level Category Range association with higher incidence of chronic pain  <18 kg/m2 Underweight   18.5-24.9 kg/m2 Ideal body weight   25-29.9 kg/m2 Overweight Increased incidence by 20%  30-34.9 kg/m2 Obese (Class I) Increased incidence by 68%  35-39.9 kg/m2 Severe obesity (Class II) Increased incidence by 136%  >40 kg/m2 Extreme obesity (Class III) Increased incidence by 254%   Patient's current BMI Ideal Body weight  Body mass index is 25.37 kg/m. Ideal body weight: 84.5 kg (186 lb 4.6 oz) Adjusted ideal body weight: 87.5 kg (192 lb 15.6 oz)   BMI Readings from Last 4 Encounters:  11/17/22 25.37 kg/m  12/26/21 25.37 kg/m  08/01/21 25.62 kg/m  03/21/20 24.62 kg/m   Wt Readings from Last 4 Encounters:  11/17/22 203 lb (92.1 kg)  12/26/21 203 lb (92.1 kg)  08/01/21 205 lb (93 kg)  03/21/20 197 lb (89.4 kg)    Psych/Mental status: Alert, oriented x 3 (person, place, & time)       Eyes: PERLA Respiratory: No evidence of acute respiratory distress Thoracic Spine Area Exam  Skin & Axial Inspection: No masses, redness, or swelling Alignment: Symmetrical Functional ROM: Unrestricted ROM Stability: No instability detected Muscle Tone/Strength: Functionally intact. No obvious neuro-muscular anomalies detected. Sensory (Neurological): Unimpaired Muscle strength & Tone: No palpable anomalies  Lumbar Spine Area Exam  Skin & Axial Inspection: No masses, redness,  or swelling Alignment: Symmetrical Functional ROM: Pain restricted ROM affecting both sides Stability: No instability detected Muscle Tone/Strength: Functionally intact. No obvious neuro-muscular anomalies detected. Sensory (Neurological): Dermatomal pain pattern Palpation: No palpable anomalies       Provocative Tests: Hyperextension/rotation test: deferred today       Lumbar quadrant test (Kemp's test): (+) on the right for foraminal stenosis  Gait & Posture Assessment  Ambulation: Unassisted Gait: Relatively normal for age and body habitus Posture: WNL  Lower Extremity Exam    Side: Right lower extremity  Side: Left lower extremity  Stability: No instability observed          Stability: No instability observed          Skin & Extremity Inspection: Skin color, temperature, and hair growth are WNL. No peripheral edema or cyanosis. No masses, redness, swelling, asymmetry, or associated skin lesions. No contractures.  Skin & Extremity Inspection: Skin color, temperature, and hair growth are WNL. No peripheral edema or cyanosis. No masses, redness, swelling, asymmetry, or associated skin lesions. No contractures.  Functional ROM: Unrestricted ROM                  Functional ROM: Unrestricted ROM  Muscle Tone/Strength: Functionally intact. No obvious neuro-muscular anomalies detected.  Muscle Tone/Strength: Functionally intact. No obvious neuro-muscular anomalies detected.  Sensory (Neurological): Unimpaired        Sensory (Neurological): Unimpaired        DTR: Patellar: deferred today Achilles: deferred today Plantar: deferred today  DTR: Patellar: deferred today Achilles: deferred today Plantar: deferred today  Palpation: No palpable anomalies  Palpation: No palpable anomalies    Assessment  Primary Diagnosis & Pertinent Problem List: The primary encounter diagnosis was Chronic radicular lumbar pain. Diagnoses of Lumbar disc herniation with radiculopathy and Chronic  pain syndrome were also pertinent to this visit.  Visit Diagnosis (New problems to examiner): 1. Chronic radicular lumbar pain   2. Lumbar disc herniation with radiculopathy   3. Chronic pain syndrome    Plan of Care (Initial workup plan)  I reviewed the patient's lumbar MRI with him in detail.  Recommend a right L4, L5 transforaminal ESI for his condition. Continue with gabapentin and amitriptyline as prescribed.   Future considerations could also include right diagnostic facet medial branch nerve block.  Procedure Orders         Lumbar Transforaminal Epidural     Provider-requested follow-up: Return in about 8 days (around 11/25/2022) for Right L4 and L5 TF ESI , in clinic [facility].  Future Appointments  Date Time Provider Department Center  01/13/2023 10:15 AM Deirdre Evener, MD [facility] None    Duration of encounter: .  Total time on encounter, as per AMA guidelines included both the face-to-face and non-face-to-face time personally spent by the physician and/or other qualified health care professional(s) on the day of the encounter (includes time in activities that require the physician or other qualified health care professional and does not include time in activities normally performed by clinical staff). Physician's time may include the following activities when performed: Preparing to see the patient (e.g., pre-charting review of records, searching for previously ordered imaging, lab work, and nerve conduction tests) Review of prior analgesic pharmacotherapies. Reviewing PMP Interpreting ordered tests (e.g., lab work, imaging, nerve conduction tests) Performing post-procedure evaluations, including interpretation of diagnostic procedures Obtaining and/or reviewing separately obtained history Performing a medically appropriate examination and/or evaluation Counseling and educating the patient/family/caregiver Ordering medications, tests, or procedures Referring and  communicating with other health care professionals (when not separately reported) Documenting clinical information in the electronic or other health record Independently interpreting results (not separately reported) and communicating results to the patient/ family/caregiver Care coordination (not separately reported)  Note by: Edward Jolly, MD (TTS technology used. I apologize for any typographical errors that were not detected and corrected.) Date: 11/17/2022; Time: 2:18 PM

## 2022-11-17 NOTE — Patient Instructions (Signed)
____________________________________________________________________________________________  General Risks and Possible Complications  Patient Responsibilities: It is important that you read this as it is part of your informed consent. It is our duty to inform you of the risks and possible complications associated with treatments offered to you. It is your responsibility as a patient to read this and to ask questions about anything that is not clear or that you believe was not covered in this document.  Patient's Rights: You have the right to refuse treatment. You also have the right to change your mind, even after initially having agreed to have the treatment done. However, under this last option, if you wait until the last second to change your mind, you may be charged for the materials used up to that point.  Introduction: Medicine is not an exact science. Everything in Medicine, including the lack of treatment(s), carries the potential for danger, harm, or loss (which is by definition: Risk). In Medicine, a complication is a secondary problem, condition, or disease that can aggravate an already existing one. All treatments carry the risk of possible complications. The fact that a side effects or complications occurs, does not imply that the treatment was conducted incorrectly. It must be clearly understood that these can happen even when everything is done following the highest safety standards.  No treatment: You can choose not to proceed with the proposed treatment alternative. The "PRO(s)" would include: avoiding the risk of complications associated with the therapy. The "CON(s)" would include: not getting any of the treatment benefits. These benefits fall under one of three categories: diagnostic; therapeutic; and/or palliative. Diagnostic benefits include: getting information which can ultimately lead to improvement of the disease or symptom(s). Therapeutic benefits are those associated with  the successful treatment of the disease. Finally, palliative benefits are those related to the decrease of the primary symptoms, without necessarily curing the condition (example: decreasing the pain from a flare-up of a chronic condition, such as incurable terminal cancer).  General Risks and Complications: These are associated to most interventional treatments. They can occur alone, or in combination. They fall under one of the following six (6) categories: no benefit or worsening of symptoms; bleeding; infection; nerve damage; allergic reactions; and/or death. No benefits or worsening of symptoms: In Medicine there are no guarantees, only probabilities. No healthcare provider can ever guarantee that a medical treatment will work, they can only state the probability that it may. Furthermore, there is always the possibility that the condition may worsen, either directly, or indirectly, as a consequence of the treatment. Bleeding: This is more common if the patient is taking a blood thinner, either prescription or over the counter (example: Goody Powders, Fish oil, Aspirin, Garlic, etc.), or if suffering a condition associated with impaired coagulation (example: Hemophilia, cirrhosis of the liver, low platelet counts, etc.). However, even if you do not have one on these, it can still happen. If you have any of these conditions, or take one of these drugs, make sure to notify your treating physician. Infection: This is more common in patients with a compromised immune system, either due to disease (example: diabetes, cancer, human immunodeficiency virus [HIV], etc.), or due to medications or treatments (example: therapies used to treat cancer and rheumatological diseases). However, even if you do not have one on these, it can still happen. If you have any of these conditions, or take one of these drugs, make sure to notify your treating physician. Nerve Damage: This is more common when the treatment is an    invasive one, but it can also happen with the use of medications, such as those used in the treatment of cancer. The damage can occur to small secondary nerves, or to large primary ones, such as those in the spinal cord and brain. This damage may be temporary or permanent and it may lead to impairments that can range from temporary numbness to permanent paralysis and/or brain death. Allergic Reactions: Any time a substance or material comes in contact with our body, there is the possibility of an allergic reaction. These can range from a mild skin rash (contact dermatitis) to a severe systemic reaction (anaphylactic reaction), which can result in death. Death: In general, any medical intervention can result in death, most of the time due to an unforeseen complication. ____________________________________________________________________________________________    ______________________________________________________________________  Preparing for your procedure  Appointments: If you think you may not be able to keep your appointment, call 24-48 hours in advance to cancel. We need time to make it available to others.  During your procedure appointment there will be: No Prescription Refills. No disability issues to discussed. No medication changes or discussions.  Instructions: Food intake: Avoid eating anything solid for at least 8 hours prior to your procedure. Clear liquid intake: You may take clear liquids such as water up to 2 hours prior to your procedure. (No carbonated drinks. No soda.) Transportation: Unless otherwise stated by your physician, bring a driver. Morning Medicines: Except for blood thinners, take all of your other morning medications with a sip of water. Make sure to take your heart and blood pressure medicines. If your blood pressure's lower number is above 100, the case will be rescheduled. Blood thinners: Make sure to stop your blood thinners as instructed.  If you take  a blood thinner, but were not instructed to stop it, call our office [phone] and ask to talk to a nurse. Not stopping a blood thinner prior to certain procedures could lead to serious complications. Diabetics on insulin: Notify the staff so that you can be scheduled 1st case in the morning. If your diabetes requires high dose insulin, take only  of your normal insulin dose the morning of the procedure and notify the staff that you have done so. Preventing infections: Shower with an antibacterial soap the morning of your procedure.  Build-up your immune system: Take 1000 mg of Vitamin C with every meal (3 times a day) the day prior to your procedure. Antibiotics: Inform the nursing staff if you are taking any antibiotics or if you have any conditions that may require antibiotics prior to procedures. (Example: recent joint implants)   Pregnancy: If you are pregnant make sure to notify the nursing staff. Not doing so may result in injury to the fetus, including death.  Sickness: If you have a cold, fever, or any active infections, call and cancel or reschedule your procedure. Receiving steroids while having an infection may result in complications. Arrival: You must be in the facility at least 30 minutes prior to your scheduled procedure. Tardiness: Your scheduled time is also the cutoff time. If you do not arrive at least 15 minutes prior to your procedure, you will be rescheduled.  Children: Do not bring any children with you. Make arrangements to keep them home. Dress appropriately: There is always a possibility that your clothing may get soiled. Avoid long dresses. Valuables: Do not bring any jewelry or valuables.  Reasons to call and reschedule or cancel your procedure: (Following these recommendations will minimize the risk   of a serious complication.) Surgeries: Avoid having procedures within 2 weeks of any surgery. (Avoid for 2 weeks before or after any surgery). Flu Shots: Avoid having  procedures within 2 weeks of a flu shots or . (Avoid for 2 weeks before or after immunizations). Barium: Avoid having a procedure within 7-10 days after having had a radiological study involving the use of radiological contrast. (Myelograms, Barium swallow or enema study). Heart attacks: Avoid any elective procedures or surgeries for the initial 6 months after a "Myocardial Infarction" (Heart Attack). Blood thinners: It is imperative that you stop these medications before procedures. Let us know if you if you take any blood thinner.  Infection: Avoid procedures during or within two weeks of an infection (including chest colds or gastrointestinal problems). Symptoms associated with infections include: Localized redness, fever, chills, night sweats or profuse sweating, burning sensation when voiding, cough, congestion, stuffiness, runny nose, sore throat, diarrhea, nausea, vomiting, cold or Flu symptoms, recent or current infections. It is specially important if the infection is over the area that we intend to treat. Heart and lung problems: Symptoms that may suggest an active cardiopulmonary problem include: cough, chest pain, breathing difficulties or shortness of breath, dizziness, ankle swelling, uncontrolled high or unusually low blood pressure, and/or palpitations. If you are experiencing any of these symptoms, cancel your procedure and contact your primary care physician for an evaluation.  Remember:  Regular Business hours are:  Monday to Thursday 8:00 AM to 4:00 PM  Provider's Schedule: Francisco Naveira, MD:  Procedure days: Tuesday and Thursday 7:30 AM to 4:00 PM  Bilal Lateef, MD:  Procedure days: Monday and Wednesday 7:30 AM to 4:00 PM  ______________________________________________________________________    

## 2022-11-17 NOTE — Progress Notes (Signed)
Safety precautions to be maintained throughout the outpatient stay will include: orient to surroundings, keep bed in low position, maintain call bell within reach at all times, provide assistance with transfer out of bed and ambulation.  

## 2022-11-26 ENCOUNTER — Telehealth: Payer: Self-pay

## 2022-11-26 NOTE — Telephone Encounter (Signed)
Patient has decided to do the procedure taking a valium. Can you call it out to [facility] in [place]. Please call patient when it is called out. He is going out of town this weekend and wants to get it before he leaves so he doesn't have to worry about it next week.

## 2022-12-04 ENCOUNTER — Telehealth: Payer: Self-pay | Admitting: Student in an Organized Health Care Education/Training Program

## 2022-12-04 NOTE — Telephone Encounter (Signed)
PT will be having an procedure done on Monday, and have some questions he will like to ask. Please give patient a call at [phone]. TY

## 2022-12-04 NOTE — Telephone Encounter (Signed)
Attempted to call patient. No answer and unable to leave a message related to full mailbox.

## 2022-12-04 NOTE — Telephone Encounter (Signed)
Patient call back. Questions answered

## 2022-12-07 ENCOUNTER — Encounter: Payer: Self-pay | Admitting: Student in an Organized Health Care Education/Training Program

## 2022-12-07 ENCOUNTER — Ambulatory Visit: Payer: Medicare Other | Admitting: Student in an Organized Health Care Education/Training Program

## 2022-12-07 ENCOUNTER — Ambulatory Visit
Admission: RE | Admit: 2022-12-07 | Discharge: 2022-12-07 | Disposition: A | Discharge status: Home / Self Care | Payer: Medicare Other | Source: Ambulatory Visit | Attending: Student in an Organized Health Care Education/Training Program | Admitting: Student in an Organized Health Care Education/Training Program

## 2022-12-07 ENCOUNTER — Ambulatory Visit
Payer: Medicare Other | Attending: Student in an Organized Health Care Education/Training Program | Admitting: Student in an Organized Health Care Education/Training Program

## 2022-12-07 DIAGNOSIS — M5416 Radiculopathy, lumbar region: Secondary | ICD-10-CM | POA: Insufficient documentation

## 2022-12-07 DIAGNOSIS — G894 Chronic pain syndrome: Secondary | ICD-10-CM | POA: Insufficient documentation

## 2022-12-07 DIAGNOSIS — G8929 Other chronic pain: Secondary | ICD-10-CM | POA: Insufficient documentation

## 2022-12-07 DIAGNOSIS — M5116 Intervertebral disc disorders with radiculopathy, lumbar region: Secondary | ICD-10-CM | POA: Insufficient documentation

## 2022-12-07 MED ORDER — DEXAMETHASONE SODIUM PHOSPHATE 10 MG/ML IJ SOLN
INTRAMUSCULAR | Status: AC
  Filled 2022-12-07: qty 1

## 2022-12-07 MED ORDER — IOHEXOL 180 MG/ML  SOLN
INTRAMUSCULAR | Status: AC
  Filled 2022-12-07: qty 20

## 2022-12-07 MED ORDER — ROPIVACAINE HCL 2 MG/ML IJ SOLN
2.0000 mL | Freq: Once | INTRAMUSCULAR | Status: AC
  Administered 2022-12-07: 20 mL via EPIDURAL

## 2022-12-07 MED ORDER — ROPIVACAINE HCL 2 MG/ML IJ SOLN
INTRAMUSCULAR | Status: AC
  Filled 2022-12-07: qty 20

## 2022-12-07 MED ORDER — DEXAMETHASONE SODIUM PHOSPHATE 10 MG/ML IJ SOLN
20.0000 mg | Freq: Once | INTRAMUSCULAR | Status: AC
  Administered 2022-12-07: 10 mg
  Filled 2022-12-07: qty 2

## 2022-12-07 MED ORDER — LIDOCAINE HCL 2 % IJ SOLN
INTRAMUSCULAR | Status: AC
  Filled 2022-12-07: qty 20

## 2022-12-07 MED ORDER — LIDOCAINE HCL 2 % IJ SOLN
20.0000 mL | Freq: Once | INTRAMUSCULAR | Status: AC
  Administered 2022-12-07: 400 mg

## 2022-12-07 MED ORDER — SODIUM CHLORIDE (PF) 0.9 % IJ SOLN
INTRAMUSCULAR | Status: AC
  Filled 2022-12-07: qty 10

## 2022-12-07 MED ORDER — IOHEXOL 180 MG/ML  SOLN
10.0000 mL | Freq: Once | INTRAMUSCULAR | Status: AC
  Administered 2022-12-07: 10 mL via EPIDURAL

## 2022-12-07 MED ORDER — SODIUM CHLORIDE 0.9% FLUSH
2.0000 mL | Freq: Once | INTRAVENOUS | Status: AC
  Administered 2022-12-07: 10 mL

## 2022-12-07 NOTE — Patient Instructions (Signed)

## 2022-12-07 NOTE — Progress Notes (Signed)
PROVIDER NOTE: Interpretation of information contained herein should be left to medically-trained personnel. Specific patient instructions are provided elsewhere under "Patient Instructions" section of medical record. This document was created in part using STT-dictation technology, any transcriptional errors that may result from this process are unintentional.  Patient: Bradley Barker Type: Established DOB: 1956/05/04 MRN: [id number] PCP: Dorothey Baseman, MD  Service: Procedure DOS: 12/07/2022 Setting: Ambulatory Location: Ambulatory outpatient facility Delivery: Face-to-face Provider: Edward Jolly, MD Specialty: Interventional Pain Management Specialty designation: 09 Location: Outpatient facility Ref. Prov.: Edward Jolly, MD       Interventional Therapy   Procedure: Lumbar trans-foraminal epidural steroid injection (L-TFESI) #1  Laterality: Right (-RT)  Level: L4 and L5  nerve root(s) Imaging: Fluoroscopy-guided         Anesthesia: Local anesthesia (1-2% Lidocaine) DOS: 12/07/2022  Performed by: Edward Jolly, MD  Purpose: Diagnostic/Therapeutic Indications: Lumbar radicular pain severe enough to impact quality of life or function. 1. Chronic radicular lumbar pain   2. Lumbar disc herniation with radiculopathy   3. Chronic pain syndrome    NAS-11 Pain score:   Pre-procedure: 6 /10   Post-procedure: 6 /10     Position / Prep / Materials:  Position: Prone  Prep solution: DuraPrep (Iodine Povacrylex [0.7% available iodine] and Isopropyl Alcohol, 74% w/w) Prep Area: Entire Posterior Lumbosacral Area.  From the lower tip of the scapula down to the tailbone and from flank to flank. Materials:  Tray: Block Needle(s):  Type: Spinal  Gauge (G): 22  Length: 3.5-in  Qty: 2      H&P (Pre-op Assessment):  Mr. Zapanta is a 67 y.o. (year old), male patient, seen today for interventional treatment. He  has a past surgical history that includes Knee arthroscopy (Bilateral); Carpal tunnel  release; Hernia repair; Tibia fracture surgery (Left); Inguinal hernia repair (Bilateral, 02/13/2016); Insertion of mesh (N/A, 02/13/2016); Cardiac catheterization; Fracture surgery (Left); Vasectomy; and Colonoscopy with propofol (N/A, 03/04/2018). Mr. Vaden has a current medication list which includes the following prescription(s): amitriptyline, arginine, fexofenadine, gabapentin, lysine, omega-3, propranolol, and tadalafil. His primarily concern today is the Back Pain (lower)  Initial Vital Signs:  Pulse/HCG Rate: 66ECG Heart Rate: 65 Temp: 98.1 F (36.7 C) Resp: 16 BP: (!) 138/96 SpO2: 97 %  BMI: Estimated body mass index is 25.62 kg/m as calculated from the following:   Height as of this encounter: 6\' 3"  (1.905 m).   Weight as of this encounter: 205 lb (93 kg).  Risk Assessment: Allergies: Reviewed. He is allergic to cyclobenzaprine, omeprazole, other, and tramadol.  Allergy Precautions: None required Coagulopathies: Reviewed. None identified.  Blood-thinner therapy: None at this time Active Infection(s): Reviewed. None identified. Mr. Radoncic is afebrile  Site Confirmation: Mr. Hafer was asked to confirm the procedure and laterality before marking the site Procedure checklist: Completed Consent: Before the procedure and under the influence of no sedative(s), amnesic(s), or anxiolytics, the patient was informed of the treatment options, risks and possible complications. To fulfill our ethical and legal obligations, as recommended by the American Medical Association's Code of Ethics, I have informed the patient of my clinical impression; the nature and purpose of the treatment or procedure; the risks, benefits, and possible complications of the intervention; the alternatives, including doing nothing; the risk(s) and benefit(s) of the alternative treatment(s) or procedure(s); and the risk(s) and benefit(s) of doing nothing. The patient was provided information about the general risks and  possible complications associated with the procedure. These may include, but are not limited to: failure  to achieve desired goals, infection, bleeding, organ or nerve damage, allergic reactions, paralysis, and death. In addition, the patient was informed of those risks and complications associated to Spine-related procedures, such as failure to decrease pain; infection (i.e.: Meningitis, epidural or intraspinal abscess); bleeding (i.e.: epidural hematoma, subarachnoid hemorrhage, or any other type of intraspinal or peri-dural bleeding); organ or nerve damage (i.e.: Any type of peripheral nerve, nerve root, or spinal cord injury) with subsequent damage to sensory, motor, and/or autonomic systems, resulting in permanent pain, numbness, and/or weakness of one or several areas of the body; allergic reactions; (i.e.: anaphylactic reaction); and/or death. Furthermore, the patient was informed of those risks and complications associated with the medications. These include, but are not limited to: allergic reactions (i.e.: anaphylactic or anaphylactoid reaction(s)); adrenal axis suppression; blood sugar elevation that in diabetics may result in ketoacidosis or comma; water retention that in patients with history of congestive heart failure may result in shortness of breath, pulmonary edema, and decompensation with resultant heart failure; weight gain; swelling or edema; medication-induced neural toxicity; particulate matter embolism and blood vessel occlusion with resultant organ, and/or nervous system infarction; and/or aseptic necrosis of one or more joints. Finally, the patient was informed that Medicine is not an exact science; therefore, there is also the possibility of unforeseen or unpredictable risks and/or possible complications that may result in a catastrophic outcome. The patient indicated having understood very clearly. We have given the patient no guarantees and we have made no promises. Enough time was  given to the patient to ask questions, all of which were answered to the patient's satisfaction. Mr. Aguiar has indicated that he wanted to continue with the procedure. Attestation: I, the ordering provider, attest that I have discussed with the patient the benefits, risks, side-effects, alternatives, likelihood of achieving goals, and potential problems during recovery for the procedure that I have provided informed consent. Date  Time: 12/07/2022  9:01 AM   Pre-Procedure Preparation:  Monitoring: As per clinic protocol. Respiration, ETCO2, SpO2, BP, heart rate and rhythm monitor placed and checked for adequate function Safety Precautions: Patient was assessed for positional comfort and pressure points before starting the procedure. Time-out: I initiated and conducted the "Time-out" before starting the procedure, as per protocol. The patient was asked to participate by confirming the accuracy of the "Time Out" information. Verification of the correct person, site, and procedure were performed and confirmed by me, the nursing staff, and the patient. "Time-out" conducted as per Joint Commission's Universal Protocol (UP.01.01.01). Time: 0947 Start Time: 0948 hrs.  Description/Narrative of Procedure:          Target: The 6 o'clock position under the pedicle, on the affected side. Region: Posterolateral Lumbosacral Approach: Posterior Percutaneous Paravertebral approach.  Rationale (medical necessity): procedure needed and proper for the diagnosis and/or treatment of the patient's medical symptoms and needs. Procedural Technique Safety Precautions: Aspiration looking for blood return was conducted prior to all injections. At no point did we inject any substances, as a needle was being advanced. No attempts were made at seeking any paresthesias. Safe injection practices and needle disposal techniques used. Medications properly checked for expiration dates. SDV (single dose vial) medications  used. Description of the Procedure: Protocol guidelines were followed. The patient was placed in position over the procedure table. The target area was identified and the area prepped in the usual manner. Skin & deeper tissues infiltrated with local anesthetic. Appropriate amount of time allowed to pass for local anesthetics to take effect. The procedure  needles were then advanced to the target area. Proper needle placement secured. Negative aspiration confirmed. Solution injected in intermittent fashion, asking for systemic symptoms every 0.5cc of injectate. The needles were then removed and the area cleansed, making sure to leave some of the prepping solution back to take advantage of its long term bactericidal properties.  Vitals:   12/07/22 0910 12/07/22 0948 12/07/22 0953 12/07/22 0957  BP: (!) 138/96 (!) 146/102 (!) 143/82 (!) (P) 150/98  Pulse: 66     Resp: 16 15 10  (P) 18  Temp: 98.1 F (36.7 C)     SpO2: 97% 98% 98% (P) 98%  Weight: 205 lb (93 kg)     Height: 6\' 3"  (1.905 m)       Start Time: 0948 hrs. End Time: 0955 hrs.  Imaging Guidance (Spinal):          Type of Imaging Technique: Fluoroscopy Guidance (Spinal) Indication(s): Assistance in needle guidance and placement for procedures requiring needle placement in or near specific anatomical locations not easily accessible without such assistance. Exposure Time: Please see nurses notes. Contrast: Before injecting any contrast, we confirmed that the patient did not have an allergy to iodine, shellfish, or radiological contrast. Once satisfactory needle placement was completed at the desired level, radiological contrast was injected. Contrast injected under live fluoroscopy. No contrast complications. See chart for type and volume of contrast used. Fluoroscopic Guidance: I was personally present during the use of fluoroscopy. "Tunnel Vision Technique" used to obtain the best possible view of the target area. Parallax error corrected  before commencing the procedure. "Direction-depth-direction" technique used to introduce the needle under continuous pulsed fluoroscopy. Once target was reached, antero-posterior, oblique, and lateral fluoroscopic projection used confirm needle placement in all planes. Images permanently stored in EMR. Interpretation: I personally interpreted the imaging intraoperatively. Adequate needle placement confirmed in multiple planes. Appropriate spread of contrast into desired area was observed. No evidence of afferent or efferent intravascular uptake. No intrathecal or subarachnoid spread observed. Permanent images saved into the patient's record.  Post-operative Assessment:  Post-procedure Vital Signs:  Pulse/HCG Rate: 66(P) 68 Temp: 98.1 F (36.7 C) Resp: (P) 18 BP: (!) (P) 150/98 SpO2: (P) 98 %  EBL: None  Complications: No immediate post-treatment complications observed by team, or reported by patient.  Note: The patient tolerated the entire procedure well. A repeat set of vitals were taken after the procedure and the patient was kept under observation following institutional policy, for this type of procedure. Post-procedural neurological assessment was performed, showing return to baseline, prior to discharge. The patient was provided with post-procedure discharge instructions, including a section on how to identify potential problems. Should any problems arise concerning this procedure, the patient was given instructions to immediately contact us, at any time, without hesitation. In any case, we plan to contact the patient by telephone for a follow-up status report regarding this interventional procedure.  Comments:  No additional relevant information.  Plan of Care (POC)  Orders:  Orders Placed This Encounter  Procedures   DG PAIN [facility] C-ARM 1-60 MIN NO REPORT    Intraoperative interpretation by procedural physician at [facility].    Standing Status:   Standing    Number of  Occurrences:   1    Order Specific Question:   Reason for exam:    Answer:   Assistance in needle guidance and placement for procedures requiring needle placement in or near specific anatomical locations not easily accessible without such assistance.  Medications ordered for procedure: Meds ordered this encounter  Medications   iohexol (OMNIPAQUE) 180 MG/ML injection 10 mL    Must be Myelogram-compatible. If not available, you may substitute with a water-soluble, non-ionic, hypoallergenic, myelogram-compatible radiological contrast medium.   lidocaine (XYLOCAINE) 2 % (with pres) injection 400 mg   sodium chloride flush (NS) 0.9 % injection 2 mL    This is for a two (2) level block. Use two (2) syringes and divide content in half.   ropivacaine (PF) 2 mg/mL (0.2%) (NAROPIN) injection 2 mL    This is for a two (2) level block. Use two (2) syringes and divide content in half.   dexamethasone (DECADRON) injection 20 mg    This is for a two (2) level block. Use two (2) syringes and divide content in half.   Medications administered: We administered iohexol, lidocaine, sodium chloride flush, ropivacaine (PF) 2 mg/mL (0.2%), and dexamethasone.  See the medical record for exact dosing, route, and time of administration.  Follow-up plan:   Return in about 3 weeks (around 12/28/2022) for PPE.       Right L4 and L5 TF ESI 12/07/22    Recent Visits Date Type Provider Dept  11/17/22 Office Visit Edward Jolly, MD [facility]  Showing recent visits within past 90 days and meeting all other requirements Today's Visits Date Type Provider Dept  12/07/22 Procedure visit Edward Jolly, MD [facility]  Showing today's visits and meeting all other requirements Future Appointments Date Type Provider Dept  01/07/23 Appointment Edward Jolly, MD [facility]  Showing future appointments within next 90 days and meeting all other requirements  Disposition: Discharge home   Discharge (Date  Time): 12/07/2022; 1005 hrs.   Primary Care Physician: Dorothey Baseman, MD Location: [facility] Note by: Edward Jolly, MD (TTS technology used. I apologize for any typographical errors that were not detected and corrected.) Date: 12/07/2022; Time: 10:19 AM  Disclaimer:  Medicine is not an Visual merchandiser. The only guarantee in medicine is that nothing is guaranteed. It is important to note that the decision to proceed with this intervention was based on the information collected from the patient. The Data and conclusions were drawn from the patient's questionnaire, the interview, and the physical examination. Because the information was provided in large part by the patient, it cannot be guaranteed that it has not been purposely or unconsciously manipulated. Every effort has been made to obtain as much relevant data as possible for this evaluation. It is important to note that the conclusions that lead to this procedure are derived in large part from the available data. Always take into account that the treatment will also be dependent on availability of resources and existing treatment guidelines, considered by other Pain Management Practitioners as being common knowledge and practice, at the time of the intervention. For Medico-Legal purposes, it is also important to point out that variation in procedural techniques and pharmacological choices are the acceptable norm. The indications, contraindications, technique, and results of the above procedure should only be interpreted and judged by a Board-Certified Interventional Pain Specialist with extensive familiarity and expertise in the same exact procedure and technique.

## 2022-12-07 NOTE — Progress Notes (Signed)
Safety precautions to be maintained throughout the outpatient stay will include: orient to surroundings, keep bed in low position, maintain call bell within reach at all times, provide assistance with transfer out of bed and ambulation.  

## 2022-12-08 ENCOUNTER — Telehealth: Payer: Self-pay

## 2022-12-08 NOTE — Telephone Encounter (Signed)
Post procedure follow up..  Patient states he is doing good 

## 2023-01-07 ENCOUNTER — Ambulatory Visit
Payer: Medicare Other | Attending: Student in an Organized Health Care Education/Training Program | Admitting: Student in an Organized Health Care Education/Training Program

## 2023-01-07 ENCOUNTER — Encounter: Payer: Self-pay | Admitting: Student in an Organized Health Care Education/Training Program

## 2023-01-07 VITALS — BP 151/94 | HR 61 | Temp 97.7°F | Resp 16 | Ht 75.0 in | Wt 205.0 lb

## 2023-01-07 DIAGNOSIS — G8929 Other chronic pain: Secondary | ICD-10-CM

## 2023-01-07 DIAGNOSIS — M4726 Other spondylosis with radiculopathy, lumbar region: Secondary | ICD-10-CM

## 2023-01-07 DIAGNOSIS — M5416 Radiculopathy, lumbar region: Secondary | ICD-10-CM | POA: Insufficient documentation

## 2023-01-07 DIAGNOSIS — M5116 Intervertebral disc disorders with radiculopathy, lumbar region: Secondary | ICD-10-CM | POA: Diagnosis present

## 2023-01-07 DIAGNOSIS — M47816 Spondylosis without myelopathy or radiculopathy, lumbar region: Secondary | ICD-10-CM

## 2023-01-07 DIAGNOSIS — G894 Chronic pain syndrome: Secondary | ICD-10-CM | POA: Diagnosis present

## 2023-01-07 NOTE — Progress Notes (Signed)
Safety precautions to be maintained throughout the outpatient stay will include: orient to surroundings, keep bed in low position, maintain call bell within reach at all times, provide assistance with transfer out of bed and ambulation.  

## 2023-01-07 NOTE — Progress Notes (Signed)
PROVIDER NOTE: Information contained herein reflects review and annotations entered in association with encounter. Interpretation of such information and data should be left to medically-trained personnel. Information provided to patient can be located elsewhere in the medical record under "Patient Instructions". Document created using STT-dictation technology, any transcriptional errors that may result from process are unintentional.    Patient: Bradley Barker  Service Category: E/M  Provider: Edward Jolly, MD  DOB: Jul 14, 1955  DOS: 01/07/2023  Referring Provider: Dorothey Baseman, MD  MRN: [id number]  Specialty: Interventional Pain Management  PCP: Dorothey Baseman, MD  Type: Established Patient  Setting: Ambulatory outpatient    Location: Office  Delivery: Face-to-face     HPI  Bradley Barker, a 67 y.o. year old male, is here today because of his Chronic radicular lumbar pain [M54.16, G89.29]. Bradley Barker primary complain today is No chief complaint on file.  Pertinent problems: Bradley Barker does not have any pertinent problems on file. Pain Assessment: Severity of Chronic pain is reported as a 1 /10. Location: Back Lower/ . Onset: More than a month ago. Quality: Aching. Timing: Intermittent. Modifying factor(s): procedure. Vitals:  height is 6\' 3"  (1.905 m) and weight is 205 lb (93 kg). His temperature is 97.7 F (36.5 C). His blood pressure is 151/94 (abnormal) and his pulse is 61. His respiration is 16 and oxygen saturation is 96%.  BMI: Estimated body mass index is 25.62 kg/m as calculated from the following:   Height as of this encounter: 6\' 3"  (1.905 m).   Weight as of this encounter: 205 lb (93 kg). Last encounter: 11/17/2022. Last procedure: 12/07/2022.  Reason for encounter: post-procedure evaluation and assessment.    Post-procedure evaluation   Procedure: Lumbar trans-foraminal epidural steroid injection (L-TFESI) #1  Laterality: Right (-RT)  Level: L4 and L5  nerve root(s) Imaging:  Fluoroscopy-guided         Anesthesia: Local anesthesia (1-2% Lidocaine) DOS: 12/07/2022  Performed by: Edward Jolly, MD  Purpose: Diagnostic/Therapeutic Indications: Lumbar radicular pain severe enough to impact quality of life or function. 1. Chronic radicular lumbar pain   2. Lumbar disc herniation with radiculopathy   3. Chronic pain syndrome    NAS-11 Pain score:   Pre-procedure: 6 /10   Post-procedure: 6 /10      Effectiveness: Initial hour after procedure: 100 % Subsequent 4-6 hours post-procedure: 100 %  Analgesia past initial 6 hours: 50 % (ongoing)  Ongoing improvement:  Analgesic:  50% Function: Bradley Barker reports improvement in function ROM: Bradley Barker reports improvement in ROM   ROS  Constitutional: Denies any fever or chills Gastrointestinal: No reported hemesis, hematochezia, vomiting, or acute GI distress Musculoskeletal: Denies any acute onset joint swelling, redness, loss of ROM, or weakness Neurological: No reported episodes of acute onset apraxia, aphasia, dysarthria, agnosia, amnesia, paralysis, loss of coordination, or loss of consciousness  Medication Review  Arginine, Lysine, Omega-3, amitriptyline, fexofenadine, gabapentin, propranolol, and tadalafil  History Review  Allergy: Bradley Barker is allergic to cyclobenzaprine, omeprazole, other, and tramadol. Drug: Bradley Barker  reports no history of drug use. Alcohol:  reports current alcohol use. Tobacco:  reports that he quit smoking about 11 years ago. His smoking use included cigarettes. He started smoking about 46 years ago. He has a 35 pack-year smoking history. He has never used smokeless tobacco. Social: Bradley Barker  reports that he quit smoking about 11 years ago. His smoking use included cigarettes. He started smoking about 46 years ago. He has a 35 pack-year smoking  history. He has never used smokeless tobacco. He reports current alcohol use. He reports that he does not use drugs. Medical:  has a past medical  history of Allergic rhinitis, Anxiety, Arthritis, Complication of anesthesia, Depression, Duodenal ulcer, Dysplastic nevus (03/14/2015), Dysrhythmia, Family history of colonic polyps, Gastritis and duodenitis, GERD (gastroesophageal reflux disease), Hemorrhoids, Orbit fracture, left (HCC), Palpitations, Sciatica of right side, and Tachycardia. Surgical: Bradley Barker  has a past surgical history that includes Knee arthroscopy (Bilateral); Carpal tunnel release; Hernia repair; Tibia fracture surgery (Left); Inguinal hernia repair (Bilateral, 02/13/2016); Insertion of mesh (N/A, 02/13/2016); Cardiac catheterization; Fracture surgery (Left); Vasectomy; and Colonoscopy with propofol (N/A, 03/04/2018). Family: family history includes Diabetes in his brother; Heart attack in his father; Heart disease in his father; Kidney cancer in his mother; Ovarian cancer in his sister.  Laboratory Chemistry Profile   Renal Lab Results  Component Value Date   BUN 18 01/23/2016   CREATININE 0.87 01/23/2016   GFRAA >60 01/23/2016   GFRNONAA >60 01/23/2016    Hepatic Lab Results  Component Value Date   AST 16 01/23/2016   ALT 22 01/23/2016   ALBUMIN 4.5 01/23/2016   ALKPHOS 82 01/23/2016    Electrolytes Lab Results  Component Value Date   NA 138 01/23/2016   K 3.8 01/23/2016   CL 102 01/23/2016   CALCIUM 10.1 01/23/2016    Bone No results found for: "VD25OH", "VD125OH2TOT", "WG9562ZH0", "QM5784ON6", "25OHVITD1", "25OHVITD2", "25OHVITD3", "TESTOFREE", "TESTOSTERONE"  Inflammation (CRP: Acute Phase) (ESR: Chronic Phase) Lab Results  Component Value Date   ESRSEDRATE 3 03/30/2013         Note: Above Lab results reviewed.  Recent Imaging Review  DG PAIN CLINIC C-ARM 1-60 MIN NO REPORT Fluoro was used, but no Radiologist interpretation will be provided.  Please refer to "NOTES" tab for provider progress note. Note: Reviewed        Physical Exam  General appearance: Well nourished, well developed, and  well hydrated. In no apparent acute distress Mental status: Alert, oriented x 3 (person, place, & time)       Respiratory: No evidence of acute respiratory distress Eyes: PERLA Vitals: BP (!) 151/94   Pulse 61   Temp 97.7 F (36.5 C)   Resp 16   Ht 6\' 3"  (1.905 m)   Wt 205 lb (93 kg)   SpO2 96%   BMI 25.62 kg/m  BMI: Estimated body mass index is 25.62 kg/m as calculated from the following:   Height as of this encounter: 6\' 3"  (1.905 m).   Weight as of this encounter: 205 lb (93 kg). Ideal: Ideal body weight: 84.5 kg (186 lb 4.6 oz) Adjusted ideal body weight: 87.9 kg (193 lb 12.4 oz)  Assessment   Diagnosis Status  1. Chronic radicular lumbar pain   2. Lumbar disc herniation with radiculopathy   3. Chronic pain syndrome   4. Lumbar facet arthropathy    Controlled Controlled Controlled   Updated Problems: No problems updated.  Plan of Care  Positive response to lumbar epidural steroid injection, right L4-L5 transforaminal ESI.  He states that his leg pain is much better.  He is endorsing increased axial low back pain that is worse with facet loading.  His lumbar MRI does show facet pathology and facet arthropathy.  We discussed diagnostic lumbar facet medial branch nerve blocks for the future if his pain were to worsen.  I will place a as needed order for a repeat right L4-L5 transforaminal ESI.  His right leg  pain increased.   Orders:  Orders Placed This Encounter  Procedures   Lumbar Transforaminal Epidural    Standing Status:   Standing    Number of Occurrences:   2    Standing Expiration Date:   01/07/2024    Scheduling Instructions:     Laterality: Right l4 and L5           Sedation: Patient's choice.     Timeframe: PRN    Order Specific Question:   Where will this procedure be performed?    Answer:   [facility]   Follow-up plan:   Return for PRN.      Right L4 and L5 TF ESI 12/07/22     Recent Visits Date Type Provider Dept  12/07/22 Procedure  visit Edward Jolly, MD [facility]  11/17/22 Office Visit Edward Jolly, MD [facility]  Showing recent visits within past 90 days and meeting all other requirements Today's Visits Date Type Provider Dept  01/07/23 Office Visit Edward Jolly, MD [facility]  Showing today's visits and meeting all other requirements Future Appointments No visits were found meeting these conditions. Showing future appointments within next 90 days and meeting all other requirements  I discussed the assessment and treatment plan with the patient. The patient was provided an opportunity to ask questions and all were answered. The patient agreed with the plan and demonstrated an understanding of the instructions.  Patient advised to call back or seek an in-person evaluation if the symptoms or condition worsens.  Duration of encounter: .  Total time on encounter, as per AMA guidelines included both the face-to-face and non-face-to-face time personally spent by the physician and/or other qualified health care professional(s) on the day of the encounter (includes time in activities that require the physician or other qualified health care professional and does not include time in activities normally performed by clinical staff). Physician's time may include the following activities when performed: Preparing to see the patient (e.g., pre-charting review of records, searching for previously ordered imaging, lab work, and nerve conduction tests) Review of prior analgesic pharmacotherapies. Reviewing PMP Interpreting ordered tests (e.g., lab work, imaging, nerve conduction tests) Performing post-procedure evaluations, including interpretation of diagnostic procedures Obtaining and/or reviewing separately obtained history Performing a medically appropriate examination and/or evaluation Counseling and educating the patient/family/caregiver Ordering medications, tests, or  procedures Referring and communicating with other health care professionals (when not separately reported) Documenting clinical information in the electronic or other health record Independently interpreting results (not separately reported) and communicating results to the patient/ family/caregiver Care coordination (not separately reported)  Note by: Edward Jolly, MD Date: 01/07/2023; Time: 2:50 PM

## 2023-01-13 ENCOUNTER — Ambulatory Visit: Payer: Medicare Other | Admitting: Dermatology

## 2023-01-13 DIAGNOSIS — L578 Other skin changes due to chronic exposure to nonionizing radiation: Secondary | ICD-10-CM | POA: Diagnosis not present

## 2023-01-13 DIAGNOSIS — W908XXA Exposure to other nonionizing radiation, initial encounter: Secondary | ICD-10-CM | POA: Diagnosis not present

## 2023-01-13 DIAGNOSIS — L820 Inflamed seborrheic keratosis: Secondary | ICD-10-CM

## 2023-01-13 DIAGNOSIS — D1801 Hemangioma of skin and subcutaneous tissue: Secondary | ICD-10-CM

## 2023-01-13 DIAGNOSIS — L720 Epidermal cyst: Secondary | ICD-10-CM

## 2023-01-13 DIAGNOSIS — Z86018 Personal history of other benign neoplasm: Secondary | ICD-10-CM

## 2023-01-13 DIAGNOSIS — Z1283 Encounter for screening for malignant neoplasm of skin: Secondary | ICD-10-CM | POA: Diagnosis not present

## 2023-01-13 DIAGNOSIS — Z7189 Other specified counseling: Secondary | ICD-10-CM

## 2023-01-13 DIAGNOSIS — D229 Melanocytic nevi, unspecified: Secondary | ICD-10-CM

## 2023-01-13 DIAGNOSIS — L729 Follicular cyst of the skin and subcutaneous tissue, unspecified: Secondary | ICD-10-CM

## 2023-01-13 DIAGNOSIS — L821 Other seborrheic keratosis: Secondary | ICD-10-CM

## 2023-01-13 DIAGNOSIS — L814 Other melanin hyperpigmentation: Secondary | ICD-10-CM

## 2023-01-13 NOTE — Progress Notes (Signed)
Follow-Up Visit   Subjective  Bradley Barker is a 67 y.o. male who presents for the following: Skin Cancer Screening and Full Body Skin Exam, hx of Dysplastic nevus.  The patient presents for Total-Body Skin Exam (TBSE) for skin cancer screening and mole check. The patient has spots, moles and lesions to be evaluated, some may be new or changing and the patient may have concern these could be cancer.    The following portions of the chart were reviewed this encounter and updated as appropriate: medications, allergies, medical history  Review of Systems:  No other skin or systemic complaints except as noted in HPI or Assessment and Plan.  Objective  Well appearing patient in no apparent distress; mood and affect are within normal limits.  A full examination was performed including scalp, head, eyes, ears, nose, lips, neck, chest, axillae, abdomen, back, buttocks, bilateral upper extremities, bilateral lower extremities, hands, feet, fingers, toes, fingernails, and toenails. All findings within normal limits unless otherwise noted below.   Relevant physical exam findings are noted in the Assessment and Plan.  left lateral neck x 1, right forehead x 1 (2) Stuck-on, waxy, tan-brown papule--Discussed benign etiology and prognosis.     Assessment & Plan   SKIN CANCER SCREENING PERFORMED TODAY.  ACTINIC DAMAGE - Chronic condition, secondary to cumulative UV/sun exposure - diffuse scaly erythematous macules with underlying dyspigmentation - Recommend daily broad spectrum sunscreen SPF 30+ to sun-exposed areas, reapply every 2 hours as needed.  - Staying in the shade or wearing long sleeves, sun glasses (UVA+UVB protection) and wide brim hats (4-inch brim around the entire circumference of the hat) are also recommended for sun protection.  - Call for new or changing lesions.  LENTIGINES, SEBORRHEIC KERATOSES, HEMANGIOMAS - Benign normal skin lesions - Benign-appearing - Call for any  changes  MELANOCYTIC NEVI - Tan-brown and/or pink-flesh-colored symmetric macules and papules - Benign appearing on exam today - Observation - Call clinic for new or changing moles - Recommend daily use of broad spectrum spf 30+ sunscreen to sun-exposed areas.   EPIDERMAL INCLUSION CYST Exam: 2 cm Subcutaneous nodule at left mid back   Benign-appearing. Exam most consistent with an epidermal inclusion cyst. Discussed that a cyst is a benign growth that can grow over time and sometimes get irritated or inflamed. Recommend observation if it is not bothersome. Discussed option of surgical excision to remove it if it is growing, symptomatic, or other changes noted. Please call for new or changing lesions so they can be evaluated. Patient wants to schedule for surgery.  Will schedule.    History of Dysplastic Nevi. Right dorsum foot, mild. 03/2015. - No evidence of recurrence today - Recommend regular full body skin exams - Recommend daily broad spectrum sunscreen SPF 30+ to sun-exposed areas, reapply every 2 hours as needed.  - Call if any new or changing lesions are noted between office visits   Inflamed seborrheic keratosis (2) left lateral neck x 1, right forehead x 1  Symptomatic, irritating, patient would like treated.   Destruction of lesion - left lateral neck x 1, right forehead x 1 (2) Complexity: simple   Destruction method: cryotherapy   Informed consent: discussed and consent obtained   Timeout:  patient name, date of birth, surgical site, and procedure verified Lesion destroyed using liquid nitrogen: Yes   Region frozen until ice ball extended beyond lesion: Yes   Outcome: patient tolerated procedure well with no complications   Post-procedure details: wound care instructions  given    Cyst of skin  Actinic skin damage  Skin cancer screening  History of dysplastic nevus  Lentigo  Melanocytic nevus, unspecified location  Counseling and coordination of  care   Return in about 1 year (around 01/13/2024) for TBSE, hx of Dysplastic nevus and cyst surgery-.  I, Angelique Holm, CMA, am acting as scribe for Armida Sans, MD .   Documentation: I have reviewed the above documentation for accuracy and completeness, and I agree with the above.  Armida Sans, MD

## 2023-01-13 NOTE — Patient Instructions (Addendum)

## 2023-01-15 ENCOUNTER — Encounter: Payer: Self-pay | Admitting: Dermatology

## 2023-01-25 ENCOUNTER — Telehealth: Payer: Self-pay | Admitting: Student in an Organized Health Care Education/Training Program

## 2023-01-25 NOTE — Telephone Encounter (Signed)
Left voicemail with patient that I need a little more information about this.

## 2023-01-25 NOTE — Telephone Encounter (Signed)
Patient called stating his pain is back. He does not want to try another epidural. He wants to try facet blocks and RFA. Please advise patient what he needs to do for this.

## 2023-01-26 ENCOUNTER — Telehealth: Payer: Self-pay | Admitting: *Deleted

## 2023-01-26 DIAGNOSIS — M47816 Spondylosis without myelopathy or radiculopathy, lumbar region: Secondary | ICD-10-CM

## 2023-01-26 NOTE — Telephone Encounter (Signed)
Have spoken with patient in great detail, message sent to FN.

## 2023-01-26 NOTE — Telephone Encounter (Signed)
Patient scheduled.

## 2023-01-26 NOTE — Telephone Encounter (Signed)
I have spoken with patient about some concerns re; how long his Right TFESI results lasted.  He reports good relief x 1 month and then the pain gradually came back.  He is asking about proceeding with ablation and I did explain the dx portion of that and how would be able to proceed with ablation.  Also, I explained that with the right TFESI, sometimes it takes a series to get the long-term results we are looking for.  A lot of education was provided during this phone call and ultimately patient would like me to communicate with BL and get his opinion and he will be glad to proceed as suggested.  I told him I would pass this info along and there is a possibility of needing to be seen or at least a VV to discuss plan of care.

## 2023-02-09 ENCOUNTER — Encounter: Payer: Medicare Other | Admitting: Dermatology

## 2023-02-10 ENCOUNTER — Ambulatory Visit
Payer: Medicare Other | Attending: Student in an Organized Health Care Education/Training Program | Admitting: Student in an Organized Health Care Education/Training Program

## 2023-02-10 ENCOUNTER — Encounter: Payer: Self-pay | Admitting: Student in an Organized Health Care Education/Training Program

## 2023-02-10 ENCOUNTER — Ambulatory Visit
Admission: RE | Admit: 2023-02-10 | Discharge: 2023-02-10 | Disposition: A | Discharge status: Home / Self Care | Payer: Medicare Other | Source: Ambulatory Visit | Attending: Student in an Organized Health Care Education/Training Program | Admitting: Student in an Organized Health Care Education/Training Program

## 2023-02-10 VITALS — BP 121/98 | HR 68 | Temp 97.3°F | Resp 15 | Ht 75.0 in | Wt 203.0 lb

## 2023-02-10 DIAGNOSIS — M47816 Spondylosis without myelopathy or radiculopathy, lumbar region: Secondary | ICD-10-CM | POA: Diagnosis present

## 2023-02-10 DIAGNOSIS — G894 Chronic pain syndrome: Secondary | ICD-10-CM | POA: Diagnosis present

## 2023-02-10 DIAGNOSIS — M5116 Intervertebral disc disorders with radiculopathy, lumbar region: Secondary | ICD-10-CM | POA: Insufficient documentation

## 2023-02-10 MED ORDER — MIDAZOLAM HCL 2 MG/2ML IJ SOLN
INTRAMUSCULAR | Status: AC
  Filled 2023-02-10: qty 2

## 2023-02-10 MED ORDER — DEXAMETHASONE SODIUM PHOSPHATE 10 MG/ML IJ SOLN
10.0000 mg | Freq: Once | INTRAMUSCULAR | Status: AC
  Administered 2023-02-10: 10 mg

## 2023-02-10 MED ORDER — ROPIVACAINE HCL 2 MG/ML IJ SOLN
9.0000 mL | Freq: Once | INTRAMUSCULAR | Status: AC
  Administered 2023-02-10: 9 mL via PERINEURAL

## 2023-02-10 MED ORDER — DEXAMETHASONE SODIUM PHOSPHATE 10 MG/ML IJ SOLN
INTRAMUSCULAR | Status: AC
  Filled 2023-02-10: qty 2

## 2023-02-10 MED ORDER — LIDOCAINE HCL 2 % IJ SOLN
20.0000 mL | Freq: Once | INTRAMUSCULAR | Status: AC
  Administered 2023-02-10: 400 mg

## 2023-02-10 MED ORDER — ROPIVACAINE HCL 2 MG/ML IJ SOLN
INTRAMUSCULAR | Status: AC
  Filled 2023-02-10: qty 20

## 2023-02-10 MED ORDER — LIDOCAINE HCL 2 % IJ SOLN
INTRAMUSCULAR | Status: AC
  Filled 2023-02-10: qty 20

## 2023-02-10 NOTE — Patient Instructions (Addendum)
______________________________________________________________________    Post-Procedure Discharge Instructions  Instructions: Apply ice:  Purpose: This will minimize any swelling and discomfort after procedure.  When: Day of procedure, as soon as you get home. How: Fill a plastic sandwich bag with crushed ice. Cover it with a small towel and apply to injection site. How long: (15 min on, 15 min off) Apply for 15 minutes then remove x 15 minutes.  Repeat sequence on day of procedure, until you go to bed. Apply heat:  Purpose: To treat any soreness and discomfort from the procedure. When: Starting the next day after the procedure. How: Apply heat to procedure site starting the day following the procedure. How long: May continue to repeat daily, until discomfort goes away. Food intake: Start with clear liquids (like water) and advance to regular food, as tolerated.  Physical activities: Keep activities to a minimum for the first 8 hours after the procedure. After that, then as tolerated. Driving: If you have received any sedation, be responsible and do not drive. You are not allowed to drive for 24 hours after having sedation. Blood thinner: (Applies only to those taking blood thinners) You may restart your blood thinner 6 hours after your procedure. Insulin: (Applies only to Diabetic patients taking insulin) As soon as you can eat, you may resume your normal dosing schedule. Infection prevention: Keep procedure site clean and dry. Shower daily and clean area with soap and water. Post-procedure Pain Diary: Extremely important that this be done correctly and accurately. Recorded information will be used to determine the next step in treatment. For the purpose of accuracy, follow these rules: Evaluate only the area treated. Do not report or include pain from an untreated area. For the purpose of this evaluation, ignore all other areas of pain, except for the treated area. After your procedure,  avoid taking a long nap and attempting to complete the pain diary after you wake up. Instead, set your alarm clock to go off every hour, on the hour, for the initial 8 hours after the procedure. Document the duration of the numbing medicine, and the relief you are getting from it. Do not go to sleep and attempt to complete it later. It will not be accurate. If you received sedation, it is likely that you were given a medication that may cause amnesia. Because of this, completing the diary at a later time may cause the information to be inaccurate. This information is needed to plan your care. Follow-up appointment: Keep your post-procedure follow-up evaluation appointment after the procedure (usually 2 weeks for most procedures, 6 weeks for radiofrequencies). DO NOT FORGET to bring you pain diary with you.   Expect: (What should I expect to see with my procedure?) From numbing medicine (AKA: Local Anesthetics): Numbness or decrease in pain. You may also experience some weakness, which if present, could last for the duration of the local anesthetic. Onset: Full effect within 15 minutes of injected. Duration: It will depend on the type of local anesthetic used. On the average, 1 to 8 hours.  From steroids (Applies only if steroids were used): Decrease in swelling or inflammation. Once inflammation is improved, relief of the pain will follow. Onset of benefits: Depends on the amount of swelling present. The more swelling, the longer it will take for the benefits to be seen. In some cases, up to 10 days. Duration: Steroids will stay in the system x 2 weeks. Duration of benefits will depend on multiple posibilities including persistent irritating factors. Side-effects: If  present, they may typically last 2 weeks (the duration of the steroids). Frequent: Cramps (if they occur, drink Gatorade and take over-the-counter Magnesium 450-500 mg once to twice a day); water retention with temporary weight gain;  increases in blood sugar; decreased immune system response; increased appetite. Occasional: Facial flushing (red, warm cheeks); mood swings; menstrual changes. Uncommon: Long-term decrease or suppression of natural hormones; bone thinning. (These are more common with higher doses or more frequent use. This is why we prefer that our patients avoid having any injection therapies in other practices.)  Very Rare: Severe mood changes; psychosis; aseptic necrosis. From procedure: Some discomfort is to be expected once the numbing medicine wears off. This should be minimal if ice and heat are applied as instructed.  Call if: (When should I call?) You experience numbness and weakness that gets worse with time, as opposed to wearing off. New onset bowel or bladder incontinence. (Applies only to procedures done in the spine)  Emergency Numbers: Durning business hours (Monday - Thursday, 8:00 AM - 4:00 PM) (Friday, 9:00 AM - 12:00 Noon): [phone] After hours: [phone] NOTE: If you are having a problem and are unable connect with, or to talk to a provider, then go to your nearest urgent care or emergency department. If the problem is serious and urgent, please call 911. ______________________________________________________________________    Facet Joint Block The facet joints connect the bones of the spine (vertebrae). They let you bend, twist, and make other movements with your spine. They also keep you from bending too far, twisting too far, and making other extreme movements. A facet joint block is a procedure where a numbing medicine (local anesthesia) is injected into a facet joint. Many times, a medicine for inflammation (steroid) is also injected. A facet joint block may be done: To diagnose neck or back pain. If the pain gets better after a facet joint block, the pain is likely coming from the facet joint. If the pain does not get better, the pain is likely not coming from the facet  joint. To treat neck or back pain caused by an inflamed facet joint. To help you with physical therapy or other rehab (rehabilitation) exercises. Tell a health care provider about: Any allergies you have. All medicines you are taking, including vitamins, herbs, eye drops, creams, and over-the-counter medicines. Any problems you or family members have had with anesthesia. Any bleeding problems you have. Any surgeries you have had. Any medical conditions you have or have had. Whether you are pregnant or may be pregnant. What are the risks? Your health care provider will talk with you about risks. These may include: Infection. Allergic reactions to medicines or dyes. Bleeding. Injury to a nerve near where the needle was put in (injection site). Pain at the injection site. Short-term weakness or numbness in areas near the nerves at the injection site. What happens before the procedure? When to stop eating and drinking Follow instructions from your health care provider about what you may eat and drink. Medicines Ask your health care provider about: Changing or stopping your regular medicines. These include any diabetes medicines or blood thinners you take. Taking medicines such as aspirin and ibuprofen. These medicines can thin your blood. Do not take these medicines unless your health care provider tells you to. Taking over-the-counter medicines, vitamins, herbs, and supplements. General instructions If you will be going home right after the procedure, plan to have a responsible adult: Take you home from the hospital or clinic. You  will not be allowed to drive. Care for you for the time you are told. Ask your health care provider: How your injection site will be marked. What steps will be taken to help prevent infection. These may include washing skin with a soap that kills germs. What happens during the procedure?  An IV will be inserted into one of your veins. You will lie on your  stomach on an X-ray table. You may be asked to lie in a different position if you will be getting an injection in your neck. Your injection site will be cleaned with a soap that kills germs and then covered with a germ-free (sterile) drape. A local anesthesia will be put in at the injection site. A type of X-ray machine (fluoroscopy) or CT scan will be used to help find your facet joint. A contrast dye may also be injected into your joint to help show if the needle is at the joint. When your provider knows the needle is at your joint, they will inject anesthesia and anti-inflammatory medicine as needed. The needle will be removed. Pressure will be applied to keep your injection site from bleeding. A bandage (dressing) will be placed over each injection site. The procedure may vary among health care providers and hospitals. What happens after the procedure? Your blood pressure, heart rate, breathing rate, and blood oxygen level will be monitored until you leave the hospital or clinic. This information is not intended to replace advice given to you by your health care provider. Make sure you discuss any questions you have with your health care provider. Document Revised: 10/31/2021 Document Reviewed: 10/31/2021 [employer] Patient Education  2024 [employer].

## 2023-02-10 NOTE — Progress Notes (Signed)
PROVIDER NOTE: Interpretation of information contained herein should be left to medically-trained personnel. Specific patient instructions are provided elsewhere under "Patient Instructions" section of medical record. This document was created in part using STT-dictation technology, any transcriptional errors that may result from this process are unintentional.  Patient: Bradley Barker Type: Established DOB: [birth year] MRN: [id number] PCP: Dorothey Baseman, MD  Service: Procedure DOS: 02/10/2023 Setting: Ambulatory Location: Ambulatory outpatient facility Delivery: Face-to-face Provider: Edward Jolly, MD Specialty: Interventional Pain Management Specialty designation: 09 Location: Outpatient facility Ref. Prov.: Edward Jolly, MD       Interventional Therapy   Procedure: Lumbar Facet, Medial Branch Block(s) #1  Laterality: Bilateral  Level: L3, L4, and L5 Medial Branch Level(s). Injecting these levels blocks the L3-4 and L4-5 lumbar facet joints. Imaging: Fluoroscopic guidance         Anesthesia: Local anesthesia (1-2% Lidocaine) Sedation: No Sedation                       DOS: 02/10/2023 Performed by: Edward Jolly, MD  Primary Purpose: Diagnostic/Therapeutic Indications: Low back pain severe enough to impact quality of life or function. 1. Lumbar facet arthropathy   2. Lumbar disc herniation with radiculopathy   3. Chronic pain syndrome    NAS-11 Pain score:   Pre-procedure: 3 /10   Post-procedure: 3 /10     Position / Prep / Materials:  Position: Prone  Prep solution: DuraPrep (Iodine Povacrylex [0.7% available iodine] and Isopropyl Alcohol, 74% w/w) Area Prepped: Posterolateral Lumbosacral Spine (Wide prep: From the lower border of the scapula down to the end of the tailbone and from flank to flank.)  Materials:  Tray: Block Needle(s):  Type: Spinal  Gauge (G): 22  Length: 3.5-in Qty: 2      H&P (Pre-op Assessment):  Bradley Barker is a 67 y.o. (year old), male patient,  seen today for interventional treatment. He  has a past surgical history that includes Knee arthroscopy (Bilateral); Carpal tunnel release; Hernia repair; Tibia fracture surgery (Left); Inguinal hernia repair (Bilateral, 02/13/2016); Insertion of mesh (N/A, 02/13/2016); Cardiac catheterization; Fracture surgery (Left); Vasectomy; and Colonoscopy with propofol (N/A, 03/04/2018). Bradley Barker has a current medication list which includes the following prescription(s): amitriptyline, arginine, fexofenadine, gabapentin, losartan, lysine, omega-3, omeprazole, propranolol, tadalafil, and vardenafil. His primarily concern today is the Back Pain (Lower and right hip)  Initial Vital Signs:  Pulse/HCG Rate: 73  Temp: (!) 97.3 F (36.3 C) Resp: 16 BP: (!) 142/95 SpO2: 97 %  BMI: Estimated body mass index is 25.37 kg/m as calculated from the following:   Height as of this encounter: 6\' 3"  (1.905 m).   Weight as of this encounter: 203 lb (92.1 kg).  Risk Assessment: Allergies: Reviewed. He is allergic to cyclobenzaprine, omeprazole, other, and tramadol.  Allergy Precautions: None required Coagulopathies: Reviewed. None identified.  Blood-thinner therapy: None at this time Active Infection(s): Reviewed. None identified. Bradley Barker is afebrile  Site Confirmation: Bradley Barker was asked to confirm the procedure and laterality before marking the site Procedure checklist: Completed Consent: Before the procedure and under the influence of no sedative(s), amnesic(s), or anxiolytics, the patient was informed of the treatment options, risks and possible complications. To fulfill our ethical and legal obligations, as recommended by the American Medical Association's Code of Ethics, I have informed the patient of my clinical impression; the nature and purpose of the treatment or procedure; the risks, benefits, and possible complications of the intervention; the alternatives, including doing nothing; the  risk(s) and benefit(s)  of the alternative treatment(s) or procedure(s); and the risk(s) and benefit(s) of doing nothing. The patient was provided information about the general risks and possible complications associated with the procedure. These may include, but are not limited to: failure to achieve desired goals, infection, bleeding, organ or nerve damage, allergic reactions, paralysis, and death. In addition, the patient was informed of those risks and complications associated to Spine-related procedures, such as failure to decrease pain; infection (i.e.: Meningitis, epidural or intraspinal abscess); bleeding (i.e.: epidural hematoma, subarachnoid hemorrhage, or any other type of intraspinal or peri-dural bleeding); organ or nerve damage (i.e.: Any type of peripheral nerve, nerve root, or spinal cord injury) with subsequent damage to sensory, motor, and/or autonomic systems, resulting in permanent pain, numbness, and/or weakness of one or several areas of the body; allergic reactions; (i.e.: anaphylactic reaction); and/or death. Furthermore, the patient was informed of those risks and complications associated with the medications. These include, but are not limited to: allergic reactions (i.e.: anaphylactic or anaphylactoid reaction(s)); adrenal axis suppression; blood sugar elevation that in diabetics may result in ketoacidosis or comma; water retention that in patients with history of congestive heart failure may result in shortness of breath, pulmonary edema, and decompensation with resultant heart failure; weight gain; swelling or edema; medication-induced neural toxicity; particulate matter embolism and blood vessel occlusion with resultant organ, and/or nervous system infarction; and/or aseptic necrosis of one or more joints. Finally, the patient was informed that Medicine is not an exact science; therefore, there is also the possibility of unforeseen or unpredictable risks and/or possible complications that may result in a  catastrophic outcome. The patient indicated having understood very clearly. We have given the patient no guarantees and we have made no promises. Enough time was given to the patient to ask questions, all of which were answered to the patient's satisfaction. Bradley Barker has indicated that he wanted to continue with the procedure. Attestation: I, the ordering provider, attest that I have discussed with the patient the benefits, risks, side-effects, alternatives, likelihood of achieving goals, and potential problems during recovery for the procedure that I have provided informed consent. Date  Time: 02/10/2023  9:17 AM   Pre-Procedure Preparation:  Monitoring: As per clinic protocol. Respiration, ETCO2, SpO2, BP, heart rate and rhythm monitor placed and checked for adequate function Safety Precautions: Patient was assessed for positional comfort and pressure points before starting the procedure. Time-out: I initiated and conducted the "Time-out" before starting the procedure, as per protocol. The patient was asked to participate by confirming the accuracy of the "Time Out" information. Verification of the correct person, site, and procedure were performed and confirmed by me, the nursing staff, and the patient. "Time-out" conducted as per Joint Commission's Universal Protocol (UP.01.01.01). Time: 0947 Start Time: 0947 hrs.  Description of Procedure:          Laterality: (see above) Targeted Levels: (see above)  Safety Precautions: Aspiration looking for blood return was conducted prior to all injections. At no point did we inject any substances, as a needle was being advanced. Before injecting, the patient was told to immediately notify me if he was experiencing any new onset of "ringing in the ears, or metallic taste in the mouth". No attempts were made at seeking any paresthesias. Safe injection practices and needle disposal techniques used. Medications properly checked for expiration dates. SDV (single  dose vial) medications used. After the completion of the procedure, all disposable equipment used was discarded in the proper designated medical  waste containers. Local Anesthesia: Protocol guidelines were followed. The patient was positioned over the fluoroscopy table. The area was prepped in the usual manner. The time-out was completed. The target area was identified using fluoroscopy. A 12-in long, straight, sterile hemostat was used with fluoroscopic guidance to locate the targets for each level blocked. Once located, the skin was marked with an approved surgical skin marker. Once all sites were marked, the skin (epidermis, dermis, and hypodermis), as well as deeper tissues (fat, connective tissue and muscle) were infiltrated with a small amount of a short-acting local anesthetic, loaded on a 10cc syringe with a 25G, 1.5-in  Needle. An appropriate amount of time was allowed for local anesthetics to take effect before proceeding to the next step. Local Anesthetic: Lidocaine 2.0% The unused portion of the local anesthetic was discarded in the proper designated containers. Technical description of process:   L3 Medial Branch Nerve Block (MBB): The target area for the L3 medial branch is at the junction of the postero-lateral aspect of the superior articular process and the superior, posterior, and medial edge of the transverse process of L4. Under fluoroscopic guidance, a Quincke needle was inserted until contact was made with os over the superior postero-lateral aspect of the pedicular shadow (target area). After negative aspiration for blood, 2mL of the nerve block solution was injected without difficulty or complication. The needle was removed intact. L4 Medial Branch Nerve Block (MBB): The target area for the L4 medial branch is at the junction of the postero-lateral aspect of the superior articular process and the superior, posterior, and medial edge of the transverse process of L5. Under fluoroscopic  guidance, a Quincke needle was inserted until contact was made with os over the superior postero-lateral aspect of the pedicular shadow (target area). After negative aspiration for blood, 2mL of the nerve block solution was injected without difficulty or complication. The needle was removed intact. L5 Medial Branch Nerve Block (MBB): The target area for the L5 medial branch is at the junction of the postero-lateral aspect of the superior articular process and the superior, posterior, and medial edge of the sacral ala. Under fluoroscopic guidance, a Quincke needle was inserted until contact was made with os over the superior postero-lateral aspect of the pedicular shadow (target area). After negative aspiration for blood, 2mL of the nerve block solution was injected without difficulty or complication. The needle was removed intact.  Once the entire procedure was completed, the treated area was cleaned, making sure to leave some of the prepping solution back to take advantage of its long term bactericidal properties.         Illustration of the posterior view of the lumbar spine and the posterior neural structures. Laminae of L2 through S1 are labeled. DPRL5, dorsal primary ramus of L5; DPRS1, dorsal primary ramus of S1; DPR3, dorsal primary ramus of L3; FJ, facet (zygapophyseal) joint L3-L4; I, inferior articular process of L4; LB1, lateral branch of dorsal primary ramus of L1; IAB, inferior articular branches from L3 medial branch (supplies L4-L5 facet joint); IBP, intermediate branch plexus; MB3, medial branch of dorsal primary ramus of L3; NR3, third lumbar nerve root; S, superior articular process of L5; SAB, superior articular branches from L4 (supplies L4-5 facet joint also); TP3, transverse process of L3.   Facet Joint Innervation (* possible contribution)  L1-2 T12, L1 (L2*)  Medial Branch  L2-3 L1, L2 (L3*)         "          "  L3-4 L2, L3 (L4*)         "          "  L4-5 L3, L4 (L5*)          "          "  L5-S1 L4, L5, S1          "          "    Vitals:   02/10/23 0920 02/10/23 0940 02/10/23 0950  BP: (!) 142/95 139/72 (!) 121/98  Pulse: 73 67 68  Resp:  16 15  Temp: (!) 97.3 F (36.3 C)    SpO2: 97% 98% 99%  Weight: 203 lb (92.1 kg)    Height: 6\' 3"  (1.905 m)       End Time: 0953 hrs.  Imaging Guidance (Spinal):          Type of Imaging Technique: Fluoroscopy Guidance (Spinal) Indication(s): Assistance in needle guidance and placement for procedures requiring needle placement in or near specific anatomical locations not easily accessible without such assistance. Exposure Time: Please see nurses notes. Contrast: None used. Fluoroscopic Guidance: I was personally present during the use of fluoroscopy. "Tunnel Vision Technique" used to obtain the best possible view of the target area. Parallax error corrected before commencing the procedure. "Direction-depth-direction" technique used to introduce the needle under continuous pulsed fluoroscopy. Once target was reached, antero-posterior, oblique, and lateral fluoroscopic projection used confirm needle placement in all planes. Images permanently stored in EMR. Interpretation: No contrast injected. I personally interpreted the imaging intraoperatively. Adequate needle placement confirmed in multiple planes. Permanent images saved into the patient's record.  Post-operative Assessment:  Post-procedure Vital Signs:  Pulse/HCG Rate: 68  Temp: (!) 97.3 F (36.3 C) Resp: 15 BP: (!) 121/98 SpO2: 99 %  EBL: None  Complications: No immediate post-treatment complications observed by team, or reported by patient.  Note: The patient tolerated the entire procedure well. A repeat set of vitals were taken after the procedure and the patient was kept under observation following institutional policy, for this type of procedure. Post-procedural neurological assessment was performed, showing return to baseline, prior to discharge. The  patient was provided with post-procedure discharge instructions, including a section on how to identify potential problems. Should any problems arise concerning this procedure, the patient was given instructions to immediately contact us, at any time, without hesitation. In any case, we plan to contact the patient by telephone for a follow-up status report regarding this interventional procedure.  Comments:  No additional relevant information.  Plan of Care (POC)  Orders:  Orders Placed This Encounter  Procedures   DG PAIN CL[facility] C-ARM 1-60 MIN NO REPORT    Intraoperative interpretation by procedural physician at [facility].    Standing Status:   Standing    Number of Occurrences:   1    Order Specific Question:   Reason for exam:    Answer:   Assistance in needle guidance and placement for procedures requiring needle placement in or near specific anatomical locations not easily accessible without such assistance.    Medications ordered for procedure: Meds ordered this encounter  Medications   lidocaine (XYLOCAINE) 2 % (with pres) injection 400 mg   dexamethasone (DECADRON) injection 10 mg   dexamethasone (DECADRON) injection 10 mg   ropivacaine (PF) 2 mg/mL (0.2%) (NAROPIN) injection 9 mL   ropivacaine (PF) 2 mg/mL (0.2%) (NAROPIN) injection 9 mL   Medications administered: We administered lidocaine, dexamethasone, dexamethasone, ropivacaine (PF)  2 mg/mL (0.2%), and ropivacaine (PF) 2 mg/mL (0.2%).  See the medical record for exact dosing, route, and time of administration.  Follow-up plan:   Return in about 13 days (around 02/23/2023) for PPE, VV.       Right L4 and L5 TF ESI 12/07/22, BLF L3-5 02/10/23      Recent Visits Date Type Provider Dept  01/07/23 Office Visit Edward Jolly, MD [facility]  12/07/22 Procedure visit Edward Jolly, MD [facility]  11/17/22 Office Visit Edward Jolly, MD [facility]  Showing recent visits within  past 90 days and meeting all other requirements Today's Visits Date Type Provider Dept  02/10/23 Procedure visit Edward Jolly, MD [facility]  Showing today's visits and meeting all other requirements Future Appointments Date Type Provider Dept  02/24/23 Appointment Edward Jolly, MD [facility]  Showing future appointments within next 90 days and meeting all other requirements  Disposition: Discharge home  Discharge (Date  Time): 02/10/2023; 0958 hrs.   Primary Care Physician: Dorothey Baseman, MD Location: [facility] Note by: Edward Jolly, MD (TTS technology used. I apologize for any typographical errors that were not detected and corrected.) Date: 02/10/2023; Time: 10:08 AM  Disclaimer:  Medicine is not an Visual merchandiser. The only guarantee in medicine is that nothing is guaranteed. It is important to note that the decision to proceed with this intervention was based on the information collected from the patient. The Data and conclusions were drawn from the patient's questionnaire, the interview, and the physical examination. Because the information was provided in large part by the patient, it cannot be guaranteed that it has not been purposely or unconsciously manipulated. Every effort has been made to obtain as much relevant data as possible for this evaluation. It is important to note that the conclusions that lead to this procedure are derived in large part from the available data. Always take into account that the treatment will also be dependent on availability of resources and existing treatment guidelines, considered by other Pain Management Practitioners as being common knowledge and practice, at the time of the intervention. For Medico-Legal purposes, it is also important to point out that variation in procedural techniques and pharmacological choices are the acceptable norm. The indications, contraindications, technique, and results  of the above procedure should only be interpreted and judged by a Board-Certified Interventional Pain Specialist with extensive familiarity and expertise in the same exact procedure and technique.

## 2023-02-10 NOTE — Progress Notes (Signed)
Safety precautions to be maintained throughout the outpatient stay will include: orient to surroundings, keep bed in low position, maintain call bell within reach at all times, provide assistance with transfer out of bed and ambulation.  

## 2023-02-11 ENCOUNTER — Telehealth: Payer: Self-pay | Admitting: *Deleted

## 2023-02-11 NOTE — Telephone Encounter (Signed)
No problems post procedure. 

## 2023-02-23 ENCOUNTER — Telehealth: Payer: Self-pay

## 2023-02-23 NOTE — Telephone Encounter (Signed)
Called patient to review procedure for 02/24/23 appointment. No answer, left message to call us back so we can review information.

## 2023-02-24 ENCOUNTER — Ambulatory Visit
Payer: Medicare Other | Attending: Student in an Organized Health Care Education/Training Program | Admitting: Student in an Organized Health Care Education/Training Program

## 2023-02-24 DIAGNOSIS — M47816 Spondylosis without myelopathy or radiculopathy, lumbar region: Secondary | ICD-10-CM

## 2023-02-24 DIAGNOSIS — M5116 Intervertebral disc disorders with radiculopathy, lumbar region: Secondary | ICD-10-CM

## 2023-02-24 NOTE — Progress Notes (Signed)
I attempted to call the patient however no response.  -Dr Wister Hoefle  

## 2023-03-16 ENCOUNTER — Telehealth: Payer: Self-pay

## 2023-03-16 ENCOUNTER — Ambulatory Visit: Payer: Medicare Other | Admitting: Dermatology

## 2023-03-16 ENCOUNTER — Encounter: Payer: Self-pay | Admitting: Student in an Organized Health Care Education/Training Program

## 2023-03-16 DIAGNOSIS — D492 Neoplasm of unspecified behavior of bone, soft tissue, and skin: Secondary | ICD-10-CM

## 2023-03-16 DIAGNOSIS — L720 Epidermal cyst: Secondary | ICD-10-CM | POA: Diagnosis not present

## 2023-03-16 MED ORDER — MUPIROCIN 2 % EX OINT: 1.0000 | TOPICAL_OINTMENT | Freq: Every day | CUTANEOUS | 1 refills | Status: DC

## 2023-03-16 NOTE — Patient Instructions (Addendum)
Wound Care Instructions  On the day following your surgery, you should begin doing daily dressing changes: Remove the old dressing and discard it. Cleanse the wound gently with tap water. This may be done in the shower or by placing a wet gauze pad directly on the wound and letting it soak for several minutes. It is important to gently remove any dried blood from the wound in order to encourage healing. This may be done by gently rolling a moistened Q-tip on the dried blood. Do not pick at the wound. If the wound should start to bleed, continue cleaning the wound, then place a moist gauze pad on the wound and hold pressure for a few minutes.  Make sure you then dry the skin surrounding the wound completely or the tape will not stick to the skin. Do not use cotton balls on the wound. After the wound is clean and dry, apply the ointment gently with a Q-tip. Cut a non-stick pad to fit the size of the wound. Lay the pad flush to the wound. If the wound is draining, you may want to reinforce it with a small amount of gauze on top of the non-stick pad for a little added compression to the area. Use the tape to seal the area completely. Select from the following with respect to your individual situation: If your wound has been stitched closed: continue the above steps 1-8 at least daily until your sutures are removed. If your wound has been left open to heal: continue steps 1-8 at least daily for the first 3-4 weeks. We would like for you to take a few extra precautions for at least the next week. Sleep with your head elevated on pillows if our wound is on your head. Do not bend over or lift heavy items to reduce the chance of elevated blood pressure to the wound Do not participate in particularly strenuous activities.   Below is a list of dressing supplies you might need.  Cotton-tipped applicators - Q-tips Gauze pads (2x2 and/or 4x4) - All-Purpose Sponges Non-stick dressing material - Telfa Tape -  Paper or Hypafix New and clean tube of petroleum jelly - Vaseline    Comments on Post-Operative Period Slight swelling and redness often appear around the wound. This is normal and will disappear within several days following the surgery. The healing wound will drain a brownish-red-yellow discharge during healing. This is a normal phase of wound healing. As the wound begins to heal, the drainage may increase in amount. Again, this drainage is normal. Notify us if the drainage becomes persistently bloody, excessively swollen, or intensely painful or develops a foul odor or red streaks.  If you should experience mild discomfort during the healing phase, you may take an aspirin-free medication such as Tylenol (acetaminophen). Notify us if the discomfort is severe or persistent. Avoid alcoholic beverages when taking pain medicine.  In Case of Wound Hemorrhage A wound hemorrhage is when the bandage suddenly becomes soaked with bright red blood and flows profusely. If this happens, sit down or lie down with your head elevated. If the wound has a dressing on it, do not remove the dressing. Apply pressure to the existing gauze. If the wound is not covered, use a gauze pad to apply pressure and continue applying the pressure for 20 minutes without peeking. DO NOT COVER THE WOUND WITH A LARGE TOWEL OR WASH CLOTH. Release your hand from the wound site but do not remove the dressing. If the bleeding has stopped,  gently clean around the wound. Leave the dressing in place for 24 hours if possible. This wait time allows the blood vessels to close off so that you do not spark a new round of bleeding by disrupting the newly clotted blood vessels with an immediate dressing change. If the bleeding does not subside, continue to hold pressure. If matters are out of your control, contact an After Hours clinic or go to the Emergency Room.  Due to recent changes in healthcare laws, you may see results of your pathology and/or  laboratory studies on MyChart before the doctors have had a chance to review them. We understand that in some cases there may be results that are confusing or concerning to you. Please understand that not all results are received at the same time and often the doctors may need to interpret multiple results in order to provide you with the best plan of care or course of treatment. Therefore, we ask that you please give [place] 2 business days to thoroughly review all your results before contacting the office for clarification. Should we see a critical lab result, you will be contacted sooner.   If You Need Anything After Your Visit  If you have any questions or concerns for your doctor, please call our main line at [phone] and press option 4 to reach your doctor's medical assistant. If no one answers, please leave a voicemail as directed and we will return your call as soon as possible. Messages left after 4 pm will be answered the following business day.   You may also send [place] a message via MyChart. We typically respond to MyChart messages within 1-2 business days.  For prescription refills, please ask your pharmacy to contact our office. Our fax number is [phone].  If you have an urgent issue when the clinic is closed that cannot wait until the next business day, you can page your doctor at the number below.    Please note that while we do our best to be available for urgent issues outside of office hours, we are not available 24/7.   If you have an urgent issue and are unable to reach [place], you may choose to seek medical care at your doctor's office, retail clinic, urgent care center, or emergency room.  If you have a medical emergency, please immediately call 911 or go to the emergency department.  Pager Numbers  - Dr. Gwen Pounds: [phone]  - Dr. Roseanne Reno: [phone]  - Dr. Katrinka Blazing: [phone]   In the event of inclement weather, please call our main line at [phone] for an  update on the status of any delays or closures.  Dermatology Medication Tips: Please keep the boxes that topical medications come in in order to help keep track of the instructions about where and how to use these. Pharmacies typically print the medication instructions only on the boxes and not directly on the medication tubes.   If your medication is too expensive, please contact our office at [phone] option 4 or send [place] a message through MyChart.   We are unable to tell what your co-pay for medications will be in advance as this is different depending on your insurance coverage. However, we may be able to find a substitute medication at lower cost or fill out paperwork to get insurance to cover a needed medication.   If a prior authorization is required to get your medication covered by your insurance company, please allow [place] 1-2 business days to complete this process.  Drug prices often vary depending on where the prescription is filled and some pharmacies may offer cheaper prices.  The website www.goodrx.com contains coupons for medications through different pharmacies. The prices here do not account for what the cost may be with help from insurance (it may be cheaper with your insurance), but the website can give you the price if you did not use any insurance.  - You can print the associated coupon and take it with your prescription to the pharmacy.  - You may also stop by our office during regular business hours and pick up a GoodRx coupon card.  - If you need your prescription sent electronically to a different pharmacy, notify our office through [facility] or by phone at [phone] option 4.     Si Usted Necesita Algo Despus de Su Visita  Tambin puede enviarnos un mensaje a travs de Clinical cytogeneticist. Por lo general respondemos a los mensajes de MyChart en el transcurso de 1 a 2 das hbiles.  Para renovar recetas, por favor pida a su farmacia que se ponga en contacto con  nuestra oficina. Annie Sable de fax es [place] [phone].  Si tiene un asunto urgente cuando la clnica est cerrada y que no puede esperar hasta el siguiente da hbil, puede llamar/localizar a su doctor(a) al nmero que aparece a continuacin.   Por favor, tenga en cuenta que aunque hacemos todo lo posible para estar disponibles para asuntos urgentes fuera del horario de [place], no estamos disponibles las 24 horas del da, los 7 [address]  [address] de la [place].   Si tiene un problema urgente y no puede comunicarse con nosotros, puede optar por buscar atencin mdica  en el consultorio de su doctor(a), en una clnica privada, en un centro de atencin urgente o en una sala de emergencias.  Si tiene Engineer, drilling, por favor llame inmediatamente al 911 o vaya a la sala de emergencias.  Nmeros de bper  - Dr. Gwen Pounds: [phone]  - Dra. Roseanne Reno: [phone]  - Dr. Katrinka Blazing: [phone]   En caso de inclemencias del tiempo, por favor llame a Lacy Duverney principal al [phone] para una actualizacin sobre el [place] de cualquier retraso o cierre.  Consejos para la medicacin en dermatologa: Por favor, guarde las cajas en las que vienen los medicamentos de uso tpico para ayudarle a seguir las instrucciones sobre dnde y cmo usarlos. Las farmacias generalmente imprimen las instrucciones del medicamento slo en las cajas y no directamente en los tubos del [place].   Si su medicamento es muy caro, por favor, pngase en contacto con Rolm Gala llamando al [phone] y presione la opcin 4 o envenos un mensaje a travs de Clinical cytogeneticist.   No podemos decirle cul ser su copago por los medicamentos por adelantado ya que esto es diferente dependiendo de la cobertura de su seguro. Sin embargo, es posible que podamos encontrar un medicamento sustituto a Audiological scientist un formulario para que el seguro cubra el medicamento que se considera necesario.   Si se requiere una autorizacin  previa para que su compaa de seguros [place] su medicamento, por favor permtanos de 1 a 2 das hbiles para completar [address]  Los precios de los medicamentos varan con frecuencia dependiendo del Environmental consultant de dnde se surte la receta y alguna farmacias pueden ofrecer precios ms baratos.  El sitio web www.goodrx.com tiene cupones para medicamentos de Health and safety inspector. Los precios aqu no tienen en cuenta lo que podra costar con la ayuda del seguro (puede ser  ms barato con su seguro), pero el sitio web puede darle el precio si no Visual merchandiser.  - Puede imprimir el cupn correspondiente y llevarlo con su receta a la farmacia.  - Tambin puede pasar por nuestra oficina durante el horario de atencin regular y Education officer, museum una tarjeta de cupones de GoodRx.  - Si necesita que su receta se enve electrnicamente a una farmacia diferente, informe a nuestra oficina a travs de MyChart de [place] o por telfono llamando al [phone] y presione la opcin 4.

## 2023-03-16 NOTE — Telephone Encounter (Signed)
Pre-appointment phone call completed for post-procedure eval.

## 2023-03-16 NOTE — Progress Notes (Signed)
   Follow-Up Visit   Subjective  Bradley Barker is a 67 y.o. male who presents for the following: Cyst L mid back, pt presents for excision The patient has spots, moles and lesions to be evaluated, some may be new or changing and the patient may have concern these could be cancer.   The following portions of the chart were reviewed this encounter and updated as appropriate: medications, allergies, medical history  Review of Systems:  No other skin or systemic complaints except as noted in HPI or Assessment and Plan.  Objective  Well appearing patient in no apparent distress; mood and affect are within normal limits.   A focused examination was performed of the following areas: back  Relevant exam findings are noted in the Assessment and Plan.  L mid back Cystic pap 3.5cm    Assessment & Plan     Neoplasm of skin L mid back  Skin excision  Lesion length (cm):  3.5 Lesion width (cm):  3.5 Margin per side (cm):  0 Total excision diameter (cm):  3.5 Informed consent: discussed and consent obtained   Timeout: patient name, date of birth, surgical site, and procedure verified   Procedure prep:  Patient was prepped and draped in usual sterile fashion Prep type:  Isopropyl alcohol and povidone-iodine Anesthesia: the lesion was anesthetized in a standard fashion   Anesthetic:  1% lidocaine w/ epinephrine 1-100,000 buffered w/ 8.4% NaHCO3 (9cc lido w/ epi, 6cc bupivicaine, Totalof 15cc) Instrument used: #15 blade   Hemostasis achieved with: pressure   Hemostasis achieved with comment:  Electrocautery Outcome: patient tolerated procedure well with no complications   Post-procedure details: sterile dressing applied and wound care instructions given   Dressing type: bandage and pressure dressing (Mupirocin)    Skin repair Complexity:  Complex Final length (cm):  3.5 Reason for type of repair: reduce tension to allow closure, reduce the risk of dehiscence, infection, and  necrosis, reduce subcutaneous dead space and avoid a hematoma, allow closure of the large defect, preserve normal anatomy, preserve normal anatomical and functional relationships and enhance both functionality and cosmetic results   Undermining: area extensively undermined   Undermining comment:  Undermining Defect 3.5cm Subcutaneous layers (deep stitches):  Suture size:  2-0 Suture type: Vicryl (polyglactin 910)   Subcutaneous suture technique: Inverted Dermal. Fine/surface layer approximation (top stitches):  Suture size:  2-0 Suture type: nylon   Stitches: simple running   Suture removal (days):  7 Hemostasis achieved with: pressure Outcome: patient tolerated procedure well with no complications   Post-procedure details: sterile dressing applied and wound care instructions given   Dressing type: bandage, pressure dressing and bacitracin (Mupirocin)    mupirocin ointment (BACTROBAN) 2 % Apply 1 Application topically daily. Qd to excision site  Specimen 1 - Surgical pathology Differential Diagnosis: D48.5 Cyst vs other  Check Margins: No Cystic pap 3.5cm  Cyst vs other excised today Start Mupirocin oint qd     Return in about 1 week (around 03/23/2023) for suture removal.  I, Ardis Rowan, RMA, am acting as scribe for Armida Sans, MD .   Documentation: I have reviewed the above documentation for accuracy and completeness, and I agree with the above.  Armida Sans, MD

## 2023-03-16 NOTE — Telephone Encounter (Signed)
Patient doing well after todays' surgery.  Advised pt to call the office if any issues otherwise to keep f/u appt./sh

## 2023-03-17 ENCOUNTER — Ambulatory Visit
Payer: Medicare Other | Attending: Student in an Organized Health Care Education/Training Program | Admitting: Student in an Organized Health Care Education/Training Program

## 2023-03-17 DIAGNOSIS — M47816 Spondylosis without myelopathy or radiculopathy, lumbar region: Secondary | ICD-10-CM

## 2023-03-17 DIAGNOSIS — G894 Chronic pain syndrome: Secondary | ICD-10-CM

## 2023-03-17 DIAGNOSIS — M5116 Intervertebral disc disorders with radiculopathy, lumbar region: Secondary | ICD-10-CM

## 2023-03-17 NOTE — Progress Notes (Signed)
Patient: Bradley Barker  Service Category: E/M  Provider: Edward Jolly, MD  DOB: [birth year]  DOS: 03/17/2023  Location: Office  MRN: [id number]  Setting: Ambulatory outpatient  Referring Provider: Dorothey Baseman, MD  Type: Established Patient  Specialty: Interventional Pain Management  PCP: Dorothey Baseman, MD  Location: Remote location  Delivery: TeleHealth     Virtual Encounter - Pain Management PROVIDER NOTE: Information contained herein reflects review and annotations entered in association with encounter. Interpretation of such information and data should be left to medically-trained personnel. Information provided to patient can be located elsewhere in the medical record under "Patient Instructions". Document created using STT-dictation technology, any transcriptional errors that may result from process are unintentional.    Contact & Pharmacy Preferred: [phone] Home: [phone] (home) Mobile: [phone] (mobile) E-mail: [email or web address]  [facility] - [place], [place] - [address] [place] [address] Phone: [phone] Fax: [phone]   Pre-screening  Bradley Barker offered "in-person" vs "virtual" encounter. He indicated preferring virtual for this encounter.   Reason COVID-19*  Social distancing based on CDC and AMA recommendations.   I contacted Bradley Barker on 03/17/2023 via telephone.      I clearly identified myself as Edward Jolly, MD. I verified that I was speaking with the correct person using two identifiers (Name: Bradley Barker, and date of birth: 15-Jul-1955).  Consent I sought verbal advanced consent from Bradley Barker for virtual visit interactions. I informed Bradley Barker of possible security and privacy concerns, risks, and limitations associated with providing "not-in-person" medical evaluation and management services. I also informed Bradley Barker of the availability of "in-person" appointments. Finally, I informed him that there would be a charge for  the virtual visit and that he could be  personally, fully or partially, financially responsible for it. Bradley Barker expressed understanding and agreed to proceed.   Historic Elements   Bradley Barker is a 67 y.o. year old, male patient evaluated today after our last contact on 02/24/2023. Bradley Barker  has a past medical history of Allergic rhinitis, Anxiety, Arthritis, Complication of anesthesia, Depression, Duodenal ulcer, Dysplastic nevus (03/14/2015), Dysrhythmia, Family history of colonic polyps, Gastritis and duodenitis, GERD (gastroesophageal reflux disease), Hemorrhoids, Orbit fracture, left (HCC), Palpitations, Sciatica of right side, and Tachycardia. He also  has a past surgical history that includes Knee arthroscopy (Bilateral); Carpal tunnel release; Hernia repair; Tibia fracture surgery (Left); Inguinal hernia repair (Bilateral, 02/13/2016); Insertion of mesh (N/A, 02/13/2016); Cardiac catheterization; Fracture surgery (Left); Vasectomy; and Colonoscopy with propofol (N/A, 03/04/2018). Bradley Barker has a current medication list which includes the following prescription(s): amitriptyline, arginine, fexofenadine, gabapentin, losartan, lysine, mupirocin ointment, omega-3, omeprazole, propranolol, tadalafil, and vardenafil. He  reports that he quit smoking about 12 years ago. His smoking use included cigarettes. He started smoking about 47 years ago. He has a 35 pack-year smoking history. He has never used smokeless tobacco. He reports current alcohol use. He reports that he does not use drugs. Mr. Kina is allergic to cyclobenzaprine, omeprazole, other, and tramadol.  BMI: Estimated body mass index is 25.37 kg/m as calculated from the following:   Height as of 02/10/23: 6\' 3"  (1.905 m).   Weight as of 02/10/23: 203 lb (92.1 kg). Last encounter: 02/24/2023. Last procedure: 02/10/2023.  HPI  Today, he is being contacted for a post-procedure assessment.   Post-procedure evaluation   Procedure: Lumbar Facet,  Medial Branch Block(s) #1  Laterality: Bilateral  Level: L3, L4, and L5 Medial Branch  Level(s). Injecting these levels blocks the L3-4 and L4-5 lumbar facet joints. Imaging: Fluoroscopic guidance         Anesthesia: Local anesthesia (1-2% Lidocaine) Sedation: No Sedation                       DOS: 02/10/2023 Performed by: Edward Jolly, MD  Primary Purpose: Diagnostic/Therapeutic Indications: Low back pain severe enough to impact quality of life or function. 1. Lumbar facet arthropathy   2. Lumbar disc herniation with radiculopathy   3. Chronic pain syndrome    NAS-11 Pain score:   Pre-procedure: 3 /10   Post-procedure: 3 /10      Effectiveness:  Initial hour after procedure: 100 %  Subsequent 4-6 hours post-procedure: 100 %  Analgesia past initial 6 hours: 100% pain relief for 1 week Ongoing improvement:  Analgesic: Approximately 20% Function: Back to baseline ROM: Back to baseline   Laboratory Chemistry Profile   Renal Lab Results  Component Value Date   BUN 18 01/23/2016   CREATININE 0.87 01/23/2016   GFRAA >60 01/23/2016   GFRNONAA >60 01/23/2016    Hepatic Lab Results  Component Value Date   AST 16 01/23/2016   ALT 22 01/23/2016   ALBUMIN 4.5 01/23/2016   ALKPHOS 82 01/23/2016    Electrolytes Lab Results  Component Value Date   NA 138 01/23/2016   K 3.8 01/23/2016   CL 102 01/23/2016   CALCIUM 10.1 01/23/2016    Bone No results found for: "VD25OH", "VD125OH2TOT", "EA5409WJ1", "BJ4782NF6", "25OHVITD1", "25OHVITD2", "25OHVITD3", "TESTOFREE", "TESTOSTERONE"  Inflammation (CRP: Acute Phase) (ESR: Chronic Phase) Lab Results  Component Value Date   ESRSEDRATE 3 03/30/2013         Note: Above Lab results reviewed.  Assessment  The primary encounter diagnosis was Lumbar facet arthropathy. Diagnoses of Lumbar disc herniation with radiculopathy and Chronic pain syndrome were also pertinent to this visit.  Plan of Care  Positive response from first set of  diagnostic lumbar facet medial branch nerve blocks as noted above.  Discussed repeating diagnostic nerve block #2 and then consider lumbar radiofrequency ablation for the purpose of obtaining longer-term pain relief.  Risks and benefits reviewed and patient would like to proceed Orders:  Orders Placed This Encounter  Procedures   LUMBAR FACET(MEDIAL BRANCH NERVE BLOCK) MBNB    Standing Status:   Future    Standing Expiration Date:   06/17/2023    Scheduling Instructions:     Procedure: Lumbar facet block (AKA.: Lumbosacral medial branch nerve block)     Side: Bilateral     Level:  L3-4, L4-5, Facets ( L3, L4, L5Medial Branch)     Sedation: w/o     Timeframe: ASAA    Order Specific Question:   Where will this procedure be performed?    Answer:   [facility]   Follow-up plan:   Return in about 12 days (around 03/29/2023) for B/L L3, 4, 5 #2, in clinic NS.      Right L4 and L5 TF ESI 12/07/22, BLF L3-5 02/10/23    Recent Visits Date Type Provider Dept  02/24/23 Office Visit Edward Jolly, MD [facility]  02/10/23 Procedure visit Edward Jolly, MD [facility]  01/07/23 Office Visit Edward Jolly, MD [facility]  Showing recent visits within past 90 days and meeting all other requirements Today's Visits Date Type Provider Dept  03/17/23 Office Visit Edward Jolly, MD [facility]  Showing today's visits and meeting  all other requirements Future Appointments No visits were found meeting these conditions. Showing future appointments within next 90 days and meeting all other requirements  I discussed the assessment and treatment plan with the patient. The patient was provided an opportunity to ask questions and all were answered. The patient agreed with the plan and demonstrated an understanding of the instructions.  Patient advised to call back or seek an in-person evaluation if the symptoms or condition worsens.  Duration of  encounter:48minutes.  Note by: Edward Jolly, MD Date: 03/17/2023; Time: 3:36 PM

## 2023-03-18 LAB — EXTERNAL GENERIC LAB PROCEDURE: COLOGUARD: POSITIVE — AB

## 2023-03-19 LAB — SURGICAL PATHOLOGY

## 2023-03-23 ENCOUNTER — Ambulatory Visit: Payer: Medicare Other | Admitting: Dermatology

## 2023-03-23 ENCOUNTER — Encounter: Payer: Self-pay | Admitting: Dermatology

## 2023-03-23 DIAGNOSIS — L729 Follicular cyst of the skin and subcutaneous tissue, unspecified: Secondary | ICD-10-CM

## 2023-03-23 DIAGNOSIS — L720 Epidermal cyst: Secondary | ICD-10-CM

## 2023-03-23 DIAGNOSIS — L820 Inflamed seborrheic keratosis: Secondary | ICD-10-CM | POA: Diagnosis not present

## 2023-03-23 DIAGNOSIS — D489 Neoplasm of uncertain behavior, unspecified: Secondary | ICD-10-CM

## 2023-03-23 NOTE — Patient Instructions (Addendum)
Electrodesiccation and Curettage ("Scrape and Burn") Wound Care Instructions  Leave the original bandage on for 24 hours if possible.  If the bandage becomes soaked or soiled before that time, it is OK to remove it and examine the wound.  A small amount of post-operative bleeding is normal.  If excessive bleeding occurs, remove the bandage, place gauze over the site and apply continuous pressure (no peeking) over the area for 30 minutes. If this does not work, please call our clinic as soon as possible or page your doctor if it is after hours.   Once a day, cleanse the wound with soap and water. It is fine to shower. If a thick crust develops you may use a Q-tip dipped into dilute hydrogen peroxide (mix 1:1 with water) to dissolve it.  Hydrogen peroxide can slow the healing process, so use it only as needed.    After washing, apply petroleum jelly (Vaseline) or an antibiotic ointment if your doctor prescribed one for you, followed by a bandage.    For best healing, the wound should be covered with a layer of ointment at all times. If you are not able to keep the area covered with a bandage to hold the ointment in place, this may mean re-applying the ointment several times a day.  Continue this wound care until the wound has healed and is no longer open. It may take several weeks for the wound to heal and close.  Itching and mild discomfort is normal during the healing process.  If you have any discomfort, you can take Tylenol (acetaminophen) or ibuprofen as directed on the bottle. (Please do not take these if you have an allergy to them or cannot take them for another reason).  Some redness, tenderness and white or yellow material in the wound is normal healing.  If the area becomes very sore and red, or develops a thick yellow-green material (pus), it may be infected; please notify us.    Wound healing continues for up to one year following surgery. It is not unusual to experience pain in the scar  from time to time during the interval.  If the pain becomes severe or the scar thickens, you should notify the office.    A slight amount of redness in a scar is expected for the first six months.  After six months, the redness will fade and the scar will soften and fade.  The color difference becomes less noticeable with time.  If there are any problems, return for a post-op surgery check at your earliest convenience.  To improve the appearance of the scar, you can use silicone scar gel, cream, or sheets (such as Mederma or Serica) every night for up to one year. These are available over the counter (without a prescription).  Please call our office at [phone] for any questions or concerns.    Biopsy Wound Care Instructions  Leave the original bandage on for 24 hours if possible.  If the bandage becomes soaked or soiled before that time, it is OK to remove it and examine the wound.  A small amount of post-operative bleeding is normal.  If excessive bleeding occurs, remove the bandage, place gauze over the site and apply continuous pressure (no peeking) over the area for 30 minutes. If this does not work, please call our clinic as soon as possible or page your doctor if it is after hours.   Once a day, cleanse the wound with soap and water. It is fine to  shower. If a thick crust develops you may use a Q-tip dipped into dilute hydrogen peroxide (mix 1:1 with water) to dissolve it.  Hydrogen peroxide can slow the healing process, so use it only as needed.    After washing, apply petroleum jelly (Vaseline) or an antibiotic ointment if your doctor prescribed one for you, followed by a bandage.    For best healing, the wound should be covered with a layer of ointment at all times. If you are not able to keep the area covered with a bandage to hold the ointment in place, this may mean re-applying the ointment several times a day.  Continue this wound care until the wound has healed and is no longer  open.   Itching and mild discomfort is normal during the healing process. However, if you develop pain or severe itching, please call our office.   If you have any discomfort, you can take Tylenol (acetaminophen) or ibuprofen as directed on the bottle. (Please do not take these if you have an allergy to them or cannot take them for another reason).  Some redness, tenderness and white or yellow material in the wound is normal healing.  If the area becomes very sore and red, or develops a thick yellow-green material (pus), it may be infected; please notify us.    If you have stitches, return to clinic as directed to have the stitches removed. You will continue wound care for 2-3 days after the stitches are removed.   Wound healing continues for up to one year following surgery. It is not unusual to experience pain in the scar from time to time during the interval.  If the pain becomes severe or the scar thickens, you should notify the office.    A slight amount of redness in a scar is expected for the first six months.  After six months, the redness will fade and the scar will soften and fade.  The color difference becomes less noticeable with time.  If there are any problems, return for a post-op surgery check at your earliest convenience.  To improve the appearance of the scar, you can use silicone scar gel, cream, or sheets (such as Mederma or Serica) every night for up to one year. These are available over the counter (without a prescription).  Please call our office at [phone] for any questions or concerns.       After Suture Removal  If your medical team has placed Steri-Strips (white adhesive strips covering the surgical site to provide extra support): Keep the area dry until they fall off.  Do not peel them off. Just let them fall off on their own.  If the edges peel up, you can trim them with scissors.   If your team has not placed Steri-Strips: Wash the area daily with  soap and water. Then coat the incision site with plain Vaseline and cover with a bandage. Do this daily for 5 days after the sutures are removed. After that, no additional wound care is generally needed.  However, if you would like to help fade the scar, you can apply a silicone scar cream, gel or sheet every night. The scar will remodel for one year after the procedure. If a skin cancer was removed, be sure to keep your appointment with your dermatologist for follow-up and let your dermatology team know if you have any new or changing spots between visits.    Please call our office at [phone] for any questions or concerns.  Due to recent changes in healthcare laws, you may see results of your pathology and/or laboratory studies on MyChart before the doctors have had a chance to review them. We understand that in some cases there may be results that are confusing or concerning to you. Please understand that not all results are received at the same time and often the doctors may need to interpret multiple results in order to provide you with the best plan of care or course of treatment. Therefore, we ask that you please give [place] 2 business days to thoroughly review all your results before contacting the office for clarification. Should we see a critical lab result, you will be contacted sooner.   If You Need Anything After Your Visit  If you have any questions or concerns for your doctor, please call our main line at [phone] and press option 4 to reach your doctor's medical assistant. If no one answers, please leave a voicemail as directed and we will return your call as soon as possible. Messages left after 4 pm will be answered the following business day.   You may also send [place] a message via MyChart. We typically respond to MyChart messages within 1-2 business days.  For prescription refills, please ask your pharmacy to contact our office. Our fax number is [phone].  If you  have an urgent issue when the clinic is closed that cannot wait until the next business day, you can page your doctor at the number below.    Please note that while we do our best to be available for urgent issues outside of office hours, we are not available 24/7.   If you have an urgent issue and are unable to reach [place], you may choose to seek medical care at your doctor's office, retail clinic, urgent care center, or emergency room.  If you have a medical emergency, please immediately call 911 or go to the emergency department.  Pager Numbers  - Dr. Gwen Pounds: [phone]  - Dr. Roseanne Reno: [phone]  - Dr. Katrinka Blazing: [phone]   In the event of inclement weather, please call our main line at [phone] for an update on the status of any delays or closures.  Dermatology Medication Tips: Please keep the boxes that topical medications come in in order to help keep track of the instructions about where and how to use these. Pharmacies typically print the medication instructions only on the boxes and not directly on the medication tubes.   If your medication is too expensive, please contact our office at [phone] option 4 or send [place] a message through MyChart.   We are unable to tell what your co-pay for medications will be in advance as this is different depending on your insurance coverage. However, we may be able to find a substitute medication at lower cost or fill out paperwork to get insurance to cover a needed medication.   If a prior authorization is required to get your medication covered by your insurance company, please allow [place] 1-2 business days to complete this process.  Drug prices often vary depending on where the prescription is filled and some pharmacies may offer cheaper prices.  The website www.goodrx.com contains coupons for medications through different pharmacies. The prices here do not account for what the cost may be with help from insurance (it may be cheaper  with your insurance), but the website can give you the price if you did not use any insurance.  - You can print the associated coupon and take  it with your prescription to the pharmacy.  - You may also stop by our office during regular business hours and pick up a GoodRx coupon card.  - If you need your prescription sent electronically to a different pharmacy, notify our office through [facility] or by phone at [phone] option 4.     Si Usted Necesita Algo Despus de Su Visita  Tambin puede enviarnos un mensaje a travs de Clinical cytogeneticist. Por lo general respondemos a los mensajes de MyChart en el transcurso de 1 a 2 das hbiles.  Para renovar recetas, por favor pida a su farmacia que se ponga en contacto con nuestra oficina. Annie Sable de fax es [place] [phone].  Si tiene un asunto urgente cuando la clnica est cerrada y que no puede esperar hasta el siguiente da hbil, puede llamar/localizar a su doctor(a) al nmero que aparece a continuacin.   Por favor, tenga en cuenta que aunque hacemos todo lo posible para estar disponibles para asuntos urgentes fuera del horario de [place], no estamos disponibles las 24 horas del da, los 7 [address]  [address] de la [place].   Si tiene un problema urgente y no puede comunicarse con nosotros, puede optar por buscar atencin mdica  en el consultorio de su doctor(a), en una clnica privada, en un centro de atencin urgente o en una sala de emergencias.  Si tiene Engineer, drilling, por favor llame inmediatamente al 911 o vaya a la sala de emergencias.  Nmeros de bper  - Dr. Gwen Pounds: [phone]  - Dra. Roseanne Reno: [phone]  - Dr. Katrinka Blazing: [phone]   En caso de inclemencias del tiempo, por favor llame a Lacy Duverney principal al [phone] para una actualizacin sobre el [place] de cualquier retraso o cierre.  Consejos para la medicacin en dermatologa: Por favor, guarde las cajas en las que vienen los medicamentos de uso tpico para  ayudarle a seguir las instrucciones sobre dnde y cmo usarlos. Las farmacias generalmente imprimen las instrucciones del medicamento slo en las cajas y no directamente en los tubos del [place].   Si su medicamento es muy caro, por favor, pngase en contacto con Rolm Gala llamando al [phone] y presione la opcin 4 o envenos un mensaje a travs de Clinical cytogeneticist.   No podemos decirle cul ser su copago por los medicamentos por adelantado ya que esto es diferente dependiendo de la cobertura de su seguro. Sin embargo, es posible que podamos encontrar un medicamento sustituto a Audiological scientist un formulario para que el seguro cubra el medicamento que se considera necesario.   Si se requiere una autorizacin previa para que su compaa de seguros [place] su medicamento, por favor permtanos de 1 a 2 das hbiles para completar [address]  Los precios de los medicamentos varan con frecuencia dependiendo del Environmental consultant de dnde se surte la receta y alguna farmacias pueden ofrecer precios ms baratos.  El sitio web www.goodrx.com tiene cupones para medicamentos de Health and safety inspector. Los precios aqu no tienen en cuenta lo que podra costar con la ayuda del seguro (puede ser ms barato con su seguro), pero el sitio web puede darle el precio si no utiliz Tourist information centre manager.  - Puede imprimir el cupn correspondiente y llevarlo con su receta a la farmacia.  - Tambin puede pasar por nuestra oficina durante el horario de atencin regular y Education officer, museum una tarjeta de cupones de GoodRx.  - Si necesita que su receta se enve electrnicamente a Psychiatrist, informe a nuestra oficina a travs de MyChart de  [place] o por telfono llamando al [phone] y presione la opcin 4.

## 2023-03-23 NOTE — Progress Notes (Signed)
Follow-Up Visit   Subjective  Bradley Barker is a 67 y.o. male who presents for the following: Suture removal Patient is also concerned about a bump at his left neck he would like checked today   Pathology showed L mid back :      EPIDERMAL INCLUSION CYST   The following portions of the chart were reviewed this encounter and updated as appropriate: medications, allergies, medical history  Review of Systems:  No other skin or systemic complaints except as noted in HPI or Assessment and Plan.  Objective  Well appearing patient in no apparent distress; mood and affect are within normal limits.  Areas Examined: Left mid back Relevant physical exam findings are noted in the Assessment and Plan.  left anterior neck 0.7 cm pink papule          Assessment & Plan   Neoplasm of uncertain behavior left anterior neck  Epidermal / dermal shaving  Lesion diameter (cm):  0.7 Informed consent: discussed and consent obtained   Timeout: patient name, date of birth, surgical site, and procedure verified   Procedure prep:  Patient was prepped and draped in usual sterile fashion Prep type:  Isopropyl alcohol Anesthesia: the lesion was anesthetized in a standard fashion   Anesthetic:  1% lidocaine w/ epinephrine 1-100,000 buffered w/ 8.4% NaHCO3 Instrument used: flexible razor blade   Hemostasis achieved with: pressure, aluminum chloride and electrodesiccation   Outcome: patient tolerated procedure well   Post-procedure details: sterile dressing applied and wound care instructions given   Dressing type: bandage and petrolatum    Destruction of lesion Complexity: extensive   Destruction method: electrodesiccation and curettage   Informed consent: discussed and consent obtained   Timeout:  patient name, date of birth, surgical site, and procedure verified Procedure prep:  Patient was prepped and draped in usual sterile fashion Prep type:  Isopropyl alcohol Anesthesia: the lesion was  anesthetized in a standard fashion   Anesthetic:  1% lidocaine w/ epinephrine 1-100,000 buffered w/ 8.4% NaHCO3 Curettage performed in three different directions: Yes   Electrodesiccation performed over the curetted area: Yes   Lesion length (cm):  0.7 Lesion width (cm):  0.7 Margin per side (cm):  0.2 Final wound size (cm):  1.1 Hemostasis achieved with:  pressure, aluminum chloride and electrodesiccation Outcome: patient tolerated procedure well with no complications   Post-procedure details: sterile dressing applied and wound care instructions given   Dressing type: bandage and petrolatum    Specimen 1 - Surgical pathology Differential Diagnosis: r/o isk vs other   Check Margins: No  R/o isk vs other    Shv removal ED&C    Encounter for Removal of Sutures - Incision site is clean, dry and intact. - Wound cleansed, sutures removed, wound cleansed and steri strips applied.  - Discussed pathology results showing L mid back :      EPIDERMAL INCLUSION CYST  - Patient advised to keep steri-strips dry until they fall off. - Scars remodel for a full year. - Once steri-strips fall off, patient can apply over-the-counter silicone scar cream once to twice a day to help with scar remodeling if desired. - Patient advised to call with any concerns or if they notice any new or changing lesions.  Return for Keep follow up as scheduled .  IAsher Muir, CMA, am acting as scribe for Armida Sans, MD.   Documentation: I have reviewed the above documentation for accuracy and completeness, and I agree with the above.  Onalee Hua  Gwen Pounds, MD

## 2023-03-24 ENCOUNTER — Telehealth: Payer: Self-pay

## 2023-03-24 LAB — SURGICAL PATHOLOGY

## 2023-03-24 NOTE — Telephone Encounter (Signed)
Advised pt of bx result/sh ?

## 2023-03-24 NOTE — Telephone Encounter (Signed)
-----   Message from Armida Sans sent at 03/24/2023  5:40 PM EST ----- FINAL DIAGNOSIS        1. Skin, left anterior neck :       SEBORRHEIC KERATOSIS, IRRITATED   Benign irritated keratosis No further treatment needed

## 2023-03-28 ENCOUNTER — Encounter: Payer: Self-pay | Admitting: Dermatology

## 2023-03-29 ENCOUNTER — Encounter: Payer: Self-pay | Admitting: Student in an Organized Health Care Education/Training Program

## 2023-03-29 ENCOUNTER — Ambulatory Visit
Admission: RE | Admit: 2023-03-29 | Discharge: 2023-03-29 | Disposition: A | Discharge status: Home / Self Care | Payer: Medicare Other | Source: Ambulatory Visit | Attending: Student in an Organized Health Care Education/Training Program | Admitting: Student in an Organized Health Care Education/Training Program

## 2023-03-29 ENCOUNTER — Ambulatory Visit
Payer: Medicare Other | Attending: Student in an Organized Health Care Education/Training Program | Admitting: Student in an Organized Health Care Education/Training Program

## 2023-03-29 VITALS — BP 151/85 | HR 70 | Temp 98.1°F | Resp 18 | Ht 75.0 in | Wt 205.0 lb

## 2023-03-29 DIAGNOSIS — G894 Chronic pain syndrome: Secondary | ICD-10-CM | POA: Insufficient documentation

## 2023-03-29 DIAGNOSIS — M47816 Spondylosis without myelopathy or radiculopathy, lumbar region: Secondary | ICD-10-CM | POA: Insufficient documentation

## 2023-03-29 MED ORDER — LIDOCAINE HCL 2 % IJ SOLN
INTRAMUSCULAR | Status: AC
  Filled 2023-03-29: qty 20

## 2023-03-29 MED ORDER — LIDOCAINE HCL 2 % IJ SOLN
20.0000 mL | Freq: Once | INTRAMUSCULAR | Status: AC
Start: 2023-03-29 — End: 2023-03-29
  Administered 2023-03-29: 400 mg

## 2023-03-29 MED ORDER — DEXAMETHASONE SODIUM PHOSPHATE 10 MG/ML IJ SOLN
20.0000 mg | Freq: Once | INTRAMUSCULAR | Status: AC
  Administered 2023-03-29: 20 mg

## 2023-03-29 MED ORDER — ROPIVACAINE HCL 2 MG/ML IJ SOLN
18.0000 mL | Freq: Once | INTRAMUSCULAR | Status: AC
Start: 2023-03-29 — End: 2023-03-29
  Administered 2023-03-29: 18 mL via PERINEURAL

## 2023-03-29 MED ORDER — ROPIVACAINE HCL 2 MG/ML IJ SOLN
INTRAMUSCULAR | Status: AC
  Filled 2023-03-29: qty 20

## 2023-03-29 MED ORDER — DEXAMETHASONE SODIUM PHOSPHATE 10 MG/ML IJ SOLN
INTRAMUSCULAR | Status: AC
  Filled 2023-03-29: qty 2

## 2023-03-29 NOTE — Patient Instructions (Signed)

## 2023-03-29 NOTE — Progress Notes (Signed)
Safety precautions to be maintained throughout the outpatient stay will include: orient to surroundings, keep bed in low position, maintain call bell within reach at all times, provide assistance with transfer out of bed and ambulation.  

## 2023-03-29 NOTE — Progress Notes (Signed)
PROVIDER NOTE: Interpretation of information contained herein should be left to medically-trained personnel. Specific patient instructions are provided elsewhere under "Patient Instructions" section of medical record. This document was created in part using STT-dictation technology, any transcriptional errors that may result from this process are unintentional.  Patient: Bradley Barker Type: Established DOB: 1956-01-06 MRN: [id number] PCP: Dorothey Baseman, MD  Service: Procedure DOS: 03/29/2023 Setting: Ambulatory Location: Ambulatory outpatient facility Delivery: Face-to-face Provider: Edward Jolly, MD Specialty: Interventional Pain Management Specialty designation: 09 Location: Outpatient facility Ref. Prov.: Dorothey Baseman, MD       Interventional Therapy   Procedure: Lumbar Facet, Medial Branch Block(s) #2  Laterality: Bilateral  Level: L3, L4, and L5 Medial Branch Level(s). Injecting these levels blocks the L3-4 and L4-5 lumbar facet joints. Imaging: Fluoroscopic guidance         Anesthesia: Local anesthesia (1-2% Lidocaine) Sedation:                         DOS: 03/29/2023 Performed by: Edward Jolly, MD  Primary Purpose: Diagnostic/Therapeutic Indications: Low back pain severe enough to impact quality of life or function. 1. Lumbar facet arthropathy   2. Chronic pain syndrome    NAS-11 Pain score:   Pre-procedure: 3 /10   Post-procedure: 3 /10     Position / Prep / Materials:  Position: Prone  Prep solution: DuraPrep (Iodine Povacrylex [0.7% available iodine] and Isopropyl Alcohol, 74% w/w) Area Prepped: Posterolateral Lumbosacral Spine (Wide prep: From the lower border of the scapula down to the end of the tailbone and from flank to flank.)  Materials:  Tray: Block Needle(s):  Type: Spinal  Gauge (G): 22  Length: 3.5-in Qty: 2      H&P (Pre-op Assessment):  Bradley Barker is a 67 y.o. (year old), male patient, seen today for interventional treatment. He  has a past  surgical history that includes Knee arthroscopy (Bilateral); Carpal tunnel release; Hernia repair; Tibia fracture surgery (Left); Inguinal hernia repair (Bilateral, 02/13/2016); Insertion of mesh (N/A, 02/13/2016); Cardiac catheterization; Fracture surgery (Left); Vasectomy; and Colonoscopy with propofol (N/A, 03/04/2018). Bradley Barker has a current medication list which includes the following prescription(s): amitriptyline, arginine, fexofenadine, losartan, lysine, omega-3, omeprazole, propranolol, tadalafil, vardenafil, gabapentin, and mupirocin ointment. His primarily concern today is the Back Pain (Lumbar bilateral, right is worse )  Initial Vital Signs:  Pulse/HCG Rate: 65  Temp: 98.1 F (36.7 C) Resp: 16 BP: (!) 146/96 SpO2: 97 %  BMI: Estimated body mass index is 25.62 kg/m as calculated from the following:   Height as of this encounter: 6\' 3"  (1.905 m).   Weight as of this encounter: 205 lb (93 kg).  Risk Assessment: Allergies: Reviewed. He is allergic to cyclobenzaprine, omeprazole, other, and tramadol.  Allergy Precautions: None required Coagulopathies: Reviewed. None identified.  Blood-thinner therapy: None at this time Active Infection(s): Reviewed. None identified. Bradley Barker is afebrile  Site Confirmation: Bradley Barker was asked to confirm the procedure and laterality before marking the site Procedure checklist: Completed Consent: Before the procedure and under the influence of no sedative(s), amnesic(s), or anxiolytics, the patient was informed of the treatment options, risks and possible complications. To fulfill our ethical and legal obligations, as recommended by the American Medical Association's Code of Ethics, I have informed the patient of my clinical impression; the nature and purpose of the treatment or procedure; the risks, benefits, and possible complications of the intervention; the alternatives, including doing nothing; the risk(s) and benefit(s) of the  alternative  treatment(s) or procedure(s); and the risk(s) and benefit(s) of doing nothing. The patient was provided information about the general risks and possible complications associated with the procedure. These may include, but are not limited to: failure to achieve desired goals, infection, bleeding, organ or nerve damage, allergic reactions, paralysis, and death. In addition, the patient was informed of those risks and complications associated to Spine-related procedures, such as failure to decrease pain; infection (i.e.: Meningitis, epidural or intraspinal abscess); bleeding (i.e.: epidural hematoma, subarachnoid hemorrhage, or any other type of intraspinal or peri-dural bleeding); organ or nerve damage (i.e.: Any type of peripheral nerve, nerve root, or spinal cord injury) with subsequent damage to sensory, motor, and/or autonomic systems, resulting in permanent pain, numbness, and/or weakness of one or several areas of the body; allergic reactions; (i.e.: anaphylactic reaction); and/or death. Furthermore, the patient was informed of those risks and complications associated with the medications. These include, but are not limited to: allergic reactions (i.e.: anaphylactic or anaphylactoid reaction(s)); adrenal axis suppression; blood sugar elevation that in diabetics may result in ketoacidosis or comma; water retention that in patients with history of congestive heart failure may result in shortness of breath, pulmonary edema, and decompensation with resultant heart failure; weight gain; swelling or edema; medication-induced neural toxicity; particulate matter embolism and blood vessel occlusion with resultant organ, and/or nervous system infarction; and/or aseptic necrosis of one or more joints. Finally, the patient was informed that Medicine is not an exact science; therefore, there is also the possibility of unforeseen or unpredictable risks and/or possible complications that may result in a catastrophic  outcome. The patient indicated having understood very clearly. We have given the patient no guarantees and we have made no promises. Enough time was given to the patient to ask questions, all of which were answered to the patient's satisfaction. Mr. Schull has indicated that he wanted to continue with the procedure. Attestation: I, the ordering provider, attest that I have discussed with the patient the benefits, risks, side-effects, alternatives, likelihood of achieving goals, and potential problems during recovery for the procedure that I have provided informed consent. Date  Time: 03/29/2023  9:38 AM   Pre-Procedure Preparation:  Monitoring: As per clinic protocol. Respiration, ETCO2, SpO2, BP, heart rate and rhythm monitor placed and checked for adequate function Safety Precautions: Patient was assessed for positional comfort and pressure points before starting the procedure. Time-out: I initiated and conducted the "Time-out" before starting the procedure, as per protocol. The patient was asked to participate by confirming the accuracy of the "Time Out" information. Verification of the correct person, site, and procedure were performed and confirmed by me, the nursing staff, and the patient. "Time-out" conducted as per Joint Commission's Universal Protocol (UP.01.01.01). Time: 1005 Start Time: 1005 hrs.  Description of Procedure:          Laterality: (see above) Targeted Levels: (see above)  Safety Precautions: Aspiration looking for blood return was conducted prior to all injections. At no point did we inject any substances, as a needle was being advanced. Before injecting, the patient was told to immediately notify me if he was experiencing any new onset of "ringing in the ears, or metallic taste in the mouth". No attempts were made at seeking any paresthesias. Safe injection practices and needle disposal techniques used. Medications properly checked for expiration dates. SDV (single dose vial)  medications used. After the completion of the procedure, all disposable equipment used was discarded in the proper designated medical waste containers. Local Anesthesia: Protocol  guidelines were followed. The patient was positioned over the fluoroscopy table. The area was prepped in the usual manner. The time-out was completed. The target area was identified using fluoroscopy. A 12-in long, straight, sterile hemostat was used with fluoroscopic guidance to locate the targets for each level blocked. Once located, the skin was marked with an approved surgical skin marker. Once all sites were marked, the skin (epidermis, dermis, and hypodermis), as well as deeper tissues (fat, connective tissue and muscle) were infiltrated with a small amount of a short-acting local anesthetic, loaded on a 10cc syringe with a 25G, 1.5-in  Needle. An appropriate amount of time was allowed for local anesthetics to take effect before proceeding to the next step. Local Anesthetic: Lidocaine 2.0% The unused portion of the local anesthetic was discarded in the proper designated containers. Technical description of process:   L3 Medial Branch Nerve Block (MBB): The target area for the L3 medial branch is at the junction of the postero-lateral aspect of the superior articular process and the superior, posterior, and medial edge of the transverse process of L4. Under fluoroscopic guidance, a Quincke needle was inserted until contact was made with os over the superior postero-lateral aspect of the pedicular shadow (target area). After negative aspiration for blood, 2mL of the nerve block solution was injected without difficulty or complication. The needle was removed intact. L4 Medial Branch Nerve Block (MBB): The target area for the L4 medial branch is at the junction of the postero-lateral aspect of the superior articular process and the superior, posterior, and medial edge of the transverse process of L5. Under fluoroscopic guidance, a  Quincke needle was inserted until contact was made with os over the superior postero-lateral aspect of the pedicular shadow (target area). After negative aspiration for blood, 2mL of the nerve block solution was injected without difficulty or complication. The needle was removed intact. L5 Medial Branch Nerve Block (MBB): The target area for the L5 medial branch is at the junction of the postero-lateral aspect of the superior articular process and the superior, posterior, and medial edge of the sacral ala. Under fluoroscopic guidance, a Quincke needle was inserted until contact was made with os over the superior postero-lateral aspect of the pedicular shadow (target area). After negative aspiration for blood, 2mL of the nerve block solution was injected without difficulty or complication. The needle was removed intact.  Once the entire procedure was completed, the treated area was cleaned, making sure to leave some of the prepping solution back to take advantage of its long term bactericidal properties.         Illustration of the posterior view of the lumbar spine and the posterior neural structures. Laminae of L2 through S1 are labeled. DPRL5, dorsal primary ramus of L5; DPRS1, dorsal primary ramus of S1; DPR3, dorsal primary ramus of L3; FJ, facet (zygapophyseal) joint L3-L4; I, inferior articular process of L4; LB1, lateral branch of dorsal primary ramus of L1; IAB, inferior articular branches from L3 medial branch (supplies L4-L5 facet joint); IBP, intermediate branch plexus; MB3, medial branch of dorsal primary ramus of L3; NR3, third lumbar nerve root; S, superior articular process of L5; SAB, superior articular branches from L4 (supplies L4-5 facet joint also); TP3, transverse process of L3.   Facet Joint Innervation (* possible contribution)  L1-2 T12, L1 (L2*)  Medial Branch  L2-3 L1, L2 (L3*)         "          "  L3-4 L2,  L3 (L4*)         "          "  L4-5 L3, L4 (L5*)         "           "  L5-S1 L4, L5, S1          "          "    Vitals:   03/29/23 1000 03/29/23 1005 03/29/23 1010 03/29/23 1015  BP: (!) 143/98 (!) 144/101 (!) 153/83 (!) 151/85  Pulse: 71 66 69 70  Resp: (!) 21 (!) 22 18 18   Temp:      TempSrc:      SpO2: 97% 98% 99% 100%  Weight:      Height:         End Time: 1013 hrs.  Imaging Guidance (Spinal):          Type of Imaging Technique: Fluoroscopy Guidance (Spinal) Indication(s): Assistance in needle guidance and placement for procedures requiring needle placement in or near specific anatomical locations not easily accessible without such assistance. Exposure Time: Please see nurses notes. Contrast: None used. Fluoroscopic Guidance: I was personally present during the use of fluoroscopy. "Tunnel Vision Technique" used to obtain the best possible view of the target area. Parallax error corrected before commencing the procedure. "Direction-depth-direction" technique used to introduce the needle under continuous pulsed fluoroscopy. Once target was reached, antero-posterior, oblique, and lateral fluoroscopic projection used confirm needle placement in all planes. Images permanently stored in EMR. Interpretation: No contrast injected. I personally interpreted the imaging intraoperatively. Adequate needle placement confirmed in multiple planes. Permanent images saved into the patient's record.  Post-operative Assessment:  Post-procedure Vital Signs:  Pulse/HCG Rate: 70  Temp: 98.1 F (36.7 C) Resp: 18 BP: (!) 151/85 SpO2: 100 %  EBL: None  Complications: No immediate post-treatment complications observed by team, or reported by patient.  Note: The patient tolerated the entire procedure well. A repeat set of vitals were taken after the procedure and the patient was kept under observation following institutional policy, for this type of procedure. Post-procedural neurological assessment was performed, showing return to baseline, prior to discharge. The  patient was provided with post-procedure discharge instructions, including a section on how to identify potential problems. Should any problems arise concerning this procedure, the patient was given instructions to immediately contact us, at any time, without hesitation. In any case, we plan to contact the patient by telephone for a follow-up status report regarding this interventional procedure.  Comments:  No additional relevant information.  Plan of Care (POC)  Orders:  Orders Placed This Encounter  Procedures   DG PAIN [facility] C-ARM 1-60 MIN NO REPORT    Intraoperative interpretation by procedural physician at [facility].    Standing Status:   Standing    Number of Occurrences:   1    Order Specific Question:   Reason for exam:    Answer:   Assistance in needle guidance and placement for procedures requiring needle placement in or near specific anatomical locations not easily accessible without such assistance.    Medications ordered for procedure: Meds ordered this encounter  Medications   lidocaine (XYLOCAINE) 2 % (with pres) injection 400 mg   ropivacaine (PF) 2 mg/mL (0.2%) (NAROPIN) injection 18 mL   dexamethasone (DECADRON) injection 20 mg   Medications administered: We administered lidocaine, ropivacaine (PF) 2 mg/mL (0.2%), and dexamethasone.  See the medical record for exact dosing, route,  and time of administration.  Follow-up plan:   Return in about 2 weeks (around 04/12/2023) for PPE, VV.       Right L4 and L5 TF ESI 12/07/22, BLF L3-5 02/10/23, 03/29/23     Recent Visits Date Type Provider Dept  03/17/23 Office Visit Edward Jolly, MD [facility]  02/24/23 Office Visit Edward Jolly, MD [facility]  02/10/23 Procedure visit Edward Jolly, MD [facility]  01/07/23 Office Visit Edward Jolly, MD [facility]  Showing recent visits within past 90 days and meeting all other requirements Today's Visits Date Type  Provider Dept  03/29/23 Procedure visit Edward Jolly, MD [facility]  Showing today's visits and meeting all other requirements Future Appointments Date Type Provider Dept  04/12/23 Appointment Edward Jolly, MD [facility]  Showing future appointments within next 90 days and meeting all other requirements  Disposition: Discharge home  Discharge (Date  Time): 03/29/2023; 1018 hrs.   Primary Care Physician: Dorothey Baseman, MD Location: [facility] Note by: Edward Jolly, MD (TTS technology used. I apologize for any typographical errors that were not detected and corrected.) Date: 03/29/2023; Time: 10:28 AM  Disclaimer:  Medicine is not an Visual merchandiser. The only guarantee in medicine is that nothing is guaranteed. It is important to note that the decision to proceed with this intervention was based on the information collected from the patient. The Data and conclusions were drawn from the patient's questionnaire, the interview, and the physical examination. Because the information was provided in large part by the patient, it cannot be guaranteed that it has not been purposely or unconsciously manipulated. Every effort has been made to obtain as much relevant data as possible for this evaluation. It is important to note that the conclusions that lead to this procedure are derived in large part from the available data. Always take into account that the treatment will also be dependent on availability of resources and existing treatment guidelines, considered by other Pain Management Practitioners as being common knowledge and practice, at the time of the intervention. For Medico-Legal purposes, it is also important to point out that variation in procedural techniques and pharmacological choices are the acceptable norm. The indications, contraindications, technique, and results of the above procedure should only be interpreted and judged by a  Board-Certified Interventional Pain Specialist with extensive familiarity and expertise in the same exact procedure and technique.

## 2023-03-30 ENCOUNTER — Telehealth: Payer: Self-pay

## 2023-03-30 NOTE — Telephone Encounter (Signed)
Post procedure follow up.  Patient states he is doing well

## 2023-04-12 ENCOUNTER — Ambulatory Visit
Payer: Medicare Other | Attending: Student in an Organized Health Care Education/Training Program | Admitting: Student in an Organized Health Care Education/Training Program

## 2023-04-12 DIAGNOSIS — M47816 Spondylosis without myelopathy or radiculopathy, lumbar region: Secondary | ICD-10-CM | POA: Diagnosis not present

## 2023-04-12 DIAGNOSIS — M5116 Intervertebral disc disorders with radiculopathy, lumbar region: Secondary | ICD-10-CM | POA: Diagnosis not present

## 2023-04-12 DIAGNOSIS — G894 Chronic pain syndrome: Secondary | ICD-10-CM

## 2023-04-12 NOTE — Progress Notes (Signed)
Patient: Bradley Barker  Service Category: E/M  Provider: Edward Jolly, MD  DOB: 1955-08-19  DOS: 04/12/2023  Location: Office  MRN: [id number]  Setting: Ambulatory outpatient  Referring Provider: Dorothey Baseman, MD  Type: Established Patient  Specialty: Interventional Pain Management  PCP: Dorothey Baseman, MD  Location: Remote location  Delivery: TeleHealth     Virtual Encounter - Pain Management PROVIDER NOTE: Information contained herein reflects review and annotations entered in association with encounter. Interpretation of such information and data should be left to medically-trained personnel. Information provided to patient can be located elsewhere in the medical record under "Patient Instructions". Document created using STT-dictation technology, any transcriptional errors that may result from process are unintentional.    Contact & Pharmacy Preferred: [phone] Home: [phone] (home) Mobile: [phone] (mobile) E-mail: [email or web address]  [facility] [place], [place] - [address] [place] [address] Phone: [phone] Fax: [phone]   Pre-screening  Mr. Shakoor offered "in-person" vs "virtual" encounter. He indicated preferring virtual for this encounter.   Reason COVID-19*  Social distancing based on CDC and AMA recommendations.   I contacted RYLEI SAAH on 04/12/2023 via telephone.      I clearly identified myself as Edward Jolly, MD. I verified that I was speaking with the correct person using two identifiers (Name: MARTEZE MARUCA, and date of birth: [birth year]).  Consent I sought verbal advanced consent from Bonnita Levan for virtual visit interactions. I informed Mr. Rittenour of possible security and privacy concerns, risks, and limitations associated with providing "not-in-person" medical evaluation and management services. I also informed Mr. Solin of the availability of "in-person" appointments. Finally, I informed him that there would be a charge for  the virtual visit and that he could be  personally, fully or partially, financially responsible for it. Mr. Filbrun expressed understanding and agreed to proceed.   Historic Elements   Mr. OREOLUWA VANDERWEIDE is a 67 y.o. year old, male patient evaluated today after our last contact on 03/29/2023. Mr. Sadek  has a past medical history of Allergic rhinitis, Anxiety, Arthritis, Complication of anesthesia, Depression, Duodenal ulcer, Dysplastic nevus (03/14/2015), Dysrhythmia, Family history of colonic polyps, Gastritis and duodenitis, GERD (gastroesophageal reflux disease), Hemorrhoids, Orbit fracture, left (HCC), Palpitations, Sciatica of right side, and Tachycardia. He also  has a past surgical history that includes Knee arthroscopy (Bilateral); Carpal tunnel release; Hernia repair; Tibia fracture surgery (Left); Inguinal hernia repair (Bilateral, 02/13/2016); Insertion of mesh (N/A, 02/13/2016); Cardiac catheterization; Fracture surgery (Left); Vasectomy; and Colonoscopy with propofol (N/A, 03/04/2018). Mr. Cotton has a current medication list which includes the following prescription(s): amitriptyline, arginine, fexofenadine, gabapentin, losartan, lysine, mupirocin ointment, omega-3, omeprazole, propranolol, tadalafil, and vardenafil. He  reports that he quit smoking about 12 years ago. His smoking use included cigarettes. He started smoking about 47 years ago. He has a 35 pack-year smoking history. He has never used smokeless tobacco. He reports current alcohol use. He reports that he does not use drugs. Mr. Funaro is allergic to cyclobenzaprine, omeprazole, other, and tramadol.  BMI: Estimated body mass index is 25.62 kg/m as calculated from the following:   Height as of 03/29/23: 6\' 3"  (1.905 m).   Weight as of 03/29/23: 205 lb (93 kg). Last encounter: 03/17/2023. Last procedure: 03/29/2023.  HPI  Today, he is being contacted for a post-procedure assessment.   Post-procedure evaluation   Procedure: Lumbar  Facet, Medial Branch Block(s) #2  Laterality: Bilateral  Level: L3, L4, and L5 Medial Branch  Level(s). Injecting these levels blocks the L3-4 and L4-5 lumbar facet joints. Imaging: Fluoroscopic guidance         Anesthesia: Local anesthesia (1-2% Lidocaine) Sedation:                         DOS: 03/29/2023 Performed by: Edward Jolly, MD  Primary Purpose: Diagnostic/Therapeutic Indications: Low back pain severe enough to impact quality of life or function. 1. Lumbar facet arthropathy   2. Chronic pain syndrome    NAS-11 Pain score:   Pre-procedure: 3 /10   Post-procedure: 3 /10      Effectiveness:  Initial hour after procedure: 100 %  Subsequent 4-6 hours post-procedure: 100 %  Analgesia past initial 6 hours: 75% for the first 5 days now <40% Ongoing improvement:  Analgesic:  40% Function: Somewhat improved ROM: Somewhat improved   Laboratory Chemistry Profile   Renal Lab Results  Component Value Date   BUN 18 01/23/2016   CREATININE 0.87 01/23/2016   GFRAA >60 01/23/2016   GFRNONAA >60 01/23/2016    Hepatic Lab Results  Component Value Date   AST 16 01/23/2016   ALT 22 01/23/2016   ALBUMIN 4.5 01/23/2016   ALKPHOS 82 01/23/2016    Electrolytes Lab Results  Component Value Date   NA 138 01/23/2016   K 3.8 01/23/2016   CL 102 01/23/2016   CALCIUM 10.1 01/23/2016    Bone No results found for: "VD25OH", "VD125OH2TOT", "HY8657QI6", "NG2952WU1", "25OHVITD1", "25OHVITD2", "25OHVITD3", "TESTOFREE", "TESTOSTERONE"  Inflammation (CRP: Acute Phase) (ESR: Chronic Phase) Lab Results  Component Value Date   ESRSEDRATE 3 03/30/2013         Note: Above Lab results reviewed.  Imaging  DG PAIN CLINIC C-ARM 1-60 MIN NO REPORT Fluoro was used, but no Radiologist interpretation will be provided.  Please refer to "NOTES" tab for provider progress note.  Assessment  The primary encounter diagnosis was Lumbar facet arthropathy. Diagnoses of Lumbar disc herniation with  radiculopathy and Chronic pain syndrome were also pertinent to this visit.  Plan of Care  Assessment and Plan    Chronic Pain related to lumbar facet arthropathy Patient is s/p 2 positive diagnostic lumbar facet medial branch nerve blocks.  We plan to proceed with lumbar radiofrequency ablation (RFA) after the new year for longer-lasting pain relief. RFA, which uses heat to target nerves, may provide a more durable response due to their positive history with nerve blocks. We discussed the need for a driver, fasting the night before, starting an IV, and providing sedation on the day of the procedure. Insurance approval is required but expected within 2-7 days. Once approved, we will call them to schedule the procedure, ensuring they bring a driver and fast the night before.      1. Lumbar facet arthropathy - Radiofrequency,Lumbar; Future  2. Lumbar disc herniation with radiculopathy  3. Chronic pain syndrome  Orders Placed This Encounter  Procedures   Radiofrequency,Lumbar    Standing Status:   Future    Standing Expiration Date:   07/11/2023    Scheduling Instructions:     Side(s): Bilateral     Level(s): L3, L4, L5, Medial Branch Nerve(s)     Sedation: With Sedation     Scheduling Timeframe: As soon as pre-approved    Order Specific Question:   Where will this procedure be performed?    Answer:   [facility]     Follow-up plan:   No follow-ups on file.  Right L4 and L5 TF ESI 12/07/22, BLF L3-5 02/10/23, 03/29/23      Recent Visits Date Type Provider Dept  03/29/23 Procedure visit Edward Jolly, MD [facility]  03/17/23 Office Visit Edward Jolly, MD [facility]  02/24/23 Office Visit Edward Jolly, MD [facility]  02/10/23 Procedure visit Edward Jolly, MD [facility]  Showing recent visits within past 90 days and meeting all other requirements Today's Visits Date Type Provider Dept  04/12/23 Office Visit Edward Jolly, MD [facility]  Showing today's visits and meeting all other requirements Future Appointments No visits were found meeting these conditions. Showing future appointments within next 90 days and meeting all other requirements  I discussed the assessment and treatment plan with the patient. The patient was provided an opportunity to ask questions and all were answered. The patient agreed with the plan and demonstrated an understanding of the instructions.  Patient advised to call back or seek an in-person evaluation if the symptoms or condition worsens.  Duration of encounter: 15 minutes.  Note by: Edward Jolly, MD Date: 04/12/2023; Time: 4:09 PM

## 2023-04-13 NOTE — Patient Instructions (Signed)
 ______________________________________________________________________    Preparing for your procedure  Appointments: If you think you may not be able to keep your appointment, call 24-48 hours in advance to cancel. We need time to make it available to others.  During your procedure appointment there will be: No Prescription Refills. No disability issues to discussed. No medication changes or discussions.  Instructions: Food intake: Avoid eating anything solid for at least 8 hours prior to your procedure. Clear liquid intake: You may take clear liquids such as water up to 2 hours prior to your procedure. (No carbonated drinks. No soda.) Transportation: Unless otherwise stated by your physician, bring a driver. (Driver cannot be a Market researcher, Pharmacist, community, or any other form of public transportation.) Morning Medicines: Except for blood thinners, take all of your other morning medications with a sip of water. Make sure to take your heart and blood pressure medicines. If your blood pressure's lower number is above 100, the case will be rescheduled. Blood thinners: Make sure to stop your blood thinners as instructed.  If you take a blood thinner, but were not instructed to stop it, call our office [phone] and ask to talk to a nurse. Not stopping a blood thinner prior to certain procedures could lead to serious complications. Diabetics on insulin: Notify the staff so that you can be scheduled 1st case in the morning. If your diabetes requires high dose insulin, take only  of your normal insulin dose the morning of the procedure and notify the staff that you have done so. Preventing infections: Shower with an antibacterial soap the morning of your procedure.  Build-up your immune system: Take 1000 mg of Vitamin C with every meal (3 times a day) the day prior to your procedure. Antibiotics: Inform the nursing staff if you are taking any antibiotics or if you have any conditions that may require antibiotics  prior to procedures. (Example: recent joint implants)   Pregnancy: If you are pregnant make sure to notify the nursing staff. Not doing so may result in injury to the fetus, including death.  Sickness: If you have a cold, fever, or any active infections, call and cancel or reschedule your procedure. Receiving steroids while having an infection may result in complications. Arrival: You must be in the facility at least 30 minutes prior to your scheduled procedure. Tardiness: Your scheduled time is also the cutoff time. If you do not arrive at least 15 minutes prior to your procedure, you will be rescheduled.  Children: Do not bring any children with you. Make arrangements to keep them home. Dress appropriately: There is always a possibility that your clothing may get soiled. Avoid long dresses. Valuables: Do not bring any jewelry or valuables.  Reasons to call and reschedule or cancel your procedure: (Following these recommendations will minimize the risk of a serious complication.) Surgeries: Avoid having procedures within 2 weeks of any surgery. (Avoid for 2 weeks before or after any surgery). Flu Shots: Avoid having procedures within 2 weeks of a flu shots or . (Avoid for 2 weeks before or after immunizations). Barium: Avoid having a procedure within 7-10 days after having had a radiological study involving the use of radiological contrast. (Myelograms, Barium swallow or enema study). Heart attacks: Avoid any elective procedures or surgeries for the initial 6 months after a "Myocardial Infarction" (Heart Attack). Blood thinners: It is imperative that you stop these medications before procedures. Let us know if you if you take any blood thinner.  Infection: Avoid procedures during or within  two weeks of an infection (including chest colds or gastrointestinal problems). Symptoms associated with infections include: Localized redness, fever, chills, night sweats or profuse sweating, burning sensation  when voiding, cough, congestion, stuffiness, runny nose, sore throat, diarrhea, nausea, vomiting, cold or Flu symptoms, recent or current infections. It is specially important if the infection is over the area that we intend to treat. Heart and lung problems: Symptoms that may suggest an active cardiopulmonary problem include: cough, chest pain, breathing difficulties or shortness of breath, dizziness, ankle swelling, uncontrolled high or unusually low blood pressure, and/or palpitations. If you are experiencing any of these symptoms, cancel your procedure and contact your primary care physician for an evaluation.  Remember:  Regular Business hours are:  Monday to Thursday 8:00 AM to 4:00 PM  Provider's Schedule: Delano Metz, MD:  Procedure days: Tuesday and Thursday 7:30 AM to 4:00 PM  Edward Jolly, MD:  Procedure days: Monday and Wednesday 7:30 AM to 4:00 PM Last  Updated: 12/22/2022 ______________________________________________________________________

## 2023-05-07 ENCOUNTER — Telehealth: Payer: Self-pay | Admitting: Student in an Organized Health Care Education/Training Program

## 2023-05-12 ENCOUNTER — Telehealth: Payer: Self-pay

## 2023-05-12 NOTE — Telephone Encounter (Signed)
 I called the patient to schedule the procedure and he was asking why he was charged for phone calls to the doctor. He said no one told him he would be charged and he has a problem with it. He would like for a supervisor to call him back to discuss.

## 2023-05-24 ENCOUNTER — Telehealth: Payer: Self-pay

## 2023-05-24 NOTE — Telephone Encounter (Signed)
His pain is under good control right now, he wants to delay the RFA. He understands that the PA expires in Feb and will have to be resubmitted if he chooses to wait until then.

## 2023-05-24 NOTE — Telephone Encounter (Signed)
The patient called and said his pain is tolerable and he is afraid to have the RFA because he doesn't want the pain to return. Can you address his concerns on this?

## 2023-05-25 NOTE — Telephone Encounter (Signed)
Ok. I cancelled the appointment. When he wants to proceed I will redo the prior auth.

## 2023-05-31 ENCOUNTER — Ambulatory Visit: Payer: Medicare Other | Admitting: Student in an Organized Health Care Education/Training Program

## 2023-06-07 ENCOUNTER — Ambulatory Visit: Payer: Medicare Other | Admitting: Student in an Organized Health Care Education/Training Program

## 2023-06-23 ENCOUNTER — Encounter: Payer: Self-pay | Admitting: Internal Medicine

## 2023-08-17 ENCOUNTER — Encounter: Payer: Self-pay | Admitting: Acute Care

## 2023-08-23 ENCOUNTER — Other Ambulatory Visit: Payer: Self-pay | Admitting: Student in an Organized Health Care Education/Training Program

## 2023-08-23 DIAGNOSIS — G894 Chronic pain syndrome: Secondary | ICD-10-CM

## 2023-08-23 DIAGNOSIS — M47816 Spondylosis without myelopathy or radiculopathy, lumbar region: Secondary | ICD-10-CM

## 2023-08-24 ENCOUNTER — Other Ambulatory Visit: Payer: Self-pay | Admitting: Acute Care

## 2023-08-24 DIAGNOSIS — Z87891 Personal history of nicotine dependence: Secondary | ICD-10-CM

## 2023-08-24 DIAGNOSIS — Z122 Encounter for screening for malignant neoplasm of respiratory organs: Secondary | ICD-10-CM

## 2023-08-25 ENCOUNTER — Ambulatory Visit

## 2023-08-25 ENCOUNTER — Ambulatory Visit
Admission: RE | Admit: 2023-08-25 | Discharge: 2023-08-25 | Disposition: A | Discharge status: Home / Self Care | Payer: Medicare Other | Attending: Internal Medicine | Admitting: Internal Medicine

## 2023-08-25 ENCOUNTER — Encounter: Payer: Self-pay | Admitting: Internal Medicine

## 2023-08-25 ENCOUNTER — Other Ambulatory Visit: Payer: Self-pay

## 2023-08-25 ENCOUNTER — Encounter
Admission: RE | Disposition: A | Discharge status: Home / Self Care | Payer: Self-pay | Source: Home / Self Care | Attending: Internal Medicine

## 2023-08-25 DIAGNOSIS — Z1211 Encounter for screening for malignant neoplasm of colon: Secondary | ICD-10-CM | POA: Diagnosis present

## 2023-08-25 DIAGNOSIS — I447 Left bundle-branch block, unspecified: Secondary | ICD-10-CM | POA: Diagnosis not present

## 2023-08-25 DIAGNOSIS — K640 First degree hemorrhoids: Secondary | ICD-10-CM | POA: Diagnosis not present

## 2023-08-25 DIAGNOSIS — K279 Peptic ulcer, site unspecified, unspecified as acute or chronic, without hemorrhage or perforation: Secondary | ICD-10-CM | POA: Insufficient documentation

## 2023-08-25 DIAGNOSIS — Z860101 Personal history of adenomatous and serrated colon polyps: Secondary | ICD-10-CM | POA: Insufficient documentation

## 2023-08-25 DIAGNOSIS — K219 Gastro-esophageal reflux disease without esophagitis: Secondary | ICD-10-CM | POA: Diagnosis not present

## 2023-08-25 DIAGNOSIS — R195 Other fecal abnormalities: Secondary | ICD-10-CM | POA: Diagnosis not present

## 2023-08-25 DIAGNOSIS — Z87891 Personal history of nicotine dependence: Secondary | ICD-10-CM | POA: Diagnosis not present

## 2023-08-25 DIAGNOSIS — I429 Cardiomyopathy, unspecified: Secondary | ICD-10-CM | POA: Insufficient documentation

## 2023-08-25 DIAGNOSIS — Z79899 Other long term (current) drug therapy: Secondary | ICD-10-CM | POA: Insufficient documentation

## 2023-08-25 DIAGNOSIS — K5730 Diverticulosis of large intestine without perforation or abscess without bleeding: Secondary | ICD-10-CM | POA: Diagnosis not present

## 2023-08-25 HISTORY — PX: COLONOSCOPY WITH PROPOFOL: SHX5780

## 2023-08-25 SURGERY — COLONOSCOPY WITH PROPOFOL
Anesthesia: General

## 2023-08-25 MED ORDER — PROPOFOL 500 MG/50ML IV EMUL
INTRAVENOUS | Status: DC | PRN
  Administered 2023-08-25: 120 mg via INTRAVENOUS
  Administered 2023-08-25: 120 ug/kg/min via INTRAVENOUS

## 2023-08-25 MED ORDER — PROPOFOL 1000 MG/100ML IV EMUL
INTRAVENOUS | Status: AC
  Filled 2023-08-25: qty 100

## 2023-08-25 MED ORDER — SODIUM CHLORIDE 0.9 % IV SOLN: INTRAVENOUS | Status: DC

## 2023-08-25 MED ORDER — LIDOCAINE HCL (CARDIAC) PF 100 MG/5ML IV SOSY
PREFILLED_SYRINGE | INTRAVENOUS | Status: DC | PRN
  Administered 2023-08-25: 100 mg via INTRAVENOUS

## 2023-08-25 MED ORDER — LIDOCAINE HCL (PF) 2 % IJ SOLN
INTRAMUSCULAR | Status: AC
  Filled 2023-08-25: qty 5

## 2023-08-25 NOTE — H&P (Signed)
 Outpatient short stay form Pre-procedure 08/25/2023 10:57 AM Bradley Barker Bradley Barker, M.D.  Primary Physician: Bradley Barker, M.D.  Reason for visit:  Positive cologuard test, personal history of colon polyps  History of present illness:  GI summary:  - 04/03/2014 for colonoscopy for family history of colon polyps. The study revealed 3 sessile polyps including a sessile serrated adenoma, hyperplastic polyp, and colonic mucosa with no diagnostic abnormalities. Bradley Barker requested repeat colonoscopy in 2018.   - LAST 03/04/2018: Colonoscopy: PH colon polyps: Impression: - Four diminutive polyps in the rectum,Resected and retrieved.Diverticulosis: Path TA x1, hyperplastic x3 . Repeat in 5 yrs.   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 He asked his PCP to run a Cologuard in place of colonoscopy last month and it returned positive.   No weight loss, anorexia, abdominal pain, change in bowel habits, or hematochezia.   Hx of GERD: Rare heartburn x1 every few months if eats spicy foods relieved with one omeprazole. He is rarely taking omeprazole. No chest pain, dysphagia, reflux, regurgitation, epigastric pain, nausea/vomiting, or melena.   Denies NSAIDs, Anti-plt agents, and anticoagulants Denies family history of gastrointestinal disease and malignancy Sister with some type of male cancer.   In preparation for the procedure: No history of CAD, CVA, cardiac surgery, sleep apnea, and problems with sedation.  Cardiology: Abnormal EKG at baseline left bundle branch block unclear etiology will increase ARB Chest pain atypical no clear evidence of ischemia recommend conservative management losartan propranolol omeprazole consider adding low-dose aspirin . Cardiomyopathy mild appeared to be nonischemic EF  around 45% with a left bundle continue conservative management Palpitations reasonably stable continue propranolol therapy     Current Facility-Administered Medications:    0.9 %  sodium chloride  infusion, , Intravenous, Continuous, Bradley August K, MD  Medications Prior to Admission  Medication Sig Dispense Refill Last Dose/Taking   amitriptyline (ELAVIL) 25 MG tablet Take 25 mg by mouth at bedtime.    08/24/2023   Arginine 500 MG CAPS Take by mouth daily.   Past Week   fexofenadine (ALLEGRA) 180 MG tablet Take 180 mg by mouth daily as needed.   Past Week   losartan (COZAAR) 25 MG tablet Take 1 tablet by mouth daily.   08/24/2023   LYSINE PO Take by mouth daily.   Past Week   Omega-3 1000 MG CAPS Take by mouth.   Past Week   propranolol (INDERAL) 40 MG tablet Take 40 mg by mouth 2 (two) times daily.    08/25/2023   gabapentin (NEURONTIN) 100 MG capsule Take 100 mg by mouth at bedtime as needed. (Patient not taking: Reported on 08/25/2023)   Completed Course   mupirocin  ointment (BACTROBAN ) 2 % Apply 1 Application topically daily. Qd to excision site 22 g 1    omeprazole (PRILOSEC) 20 MG capsule Take 20 mg by mouth daily. prn      tadalafil (ADCIRCA/CIALIS) 20 MG tablet Take 20 mg by mouth daily as needed for erectile dysfunction.      vardenafil (LEVITRA) 20 MG tablet Take 20 mg by mouth daily as needed for erectile dysfunction.        Allergies  Allergen Reactions   Cyclobenzaprine Other (See Comments)    Lip swelling  [Onset: 06/28/2012]   Omeprazole Other (See Comments)    HEADACHES   Other  PT STATES CERTAIN PPI'S CAUSE HEADACHES-PT TAKING OMEPRAZOLE AND IS TOLERATING WELL (02-11-16)   Tramadol Swelling     [Onset: 06/21/2012]     Past Medical History:  Diagnosis Date   Allergic rhinitis    Anxiety    Arthritis    Complication of anesthesia    HARD TO WAKE UP AFTER KNEE SURGERY (2005)   Depression    Duodenal ulcer    Dysplastic nevus 03/14/2015   Right dorsum  foot. Mild atypia, limited margins free.   Dysrhythmia    H/O PALPITATIONS-ON PROPRANOLOL AND HAS CONTROLLED PALPITATIONS   Family history of colonic polyps    Gastritis and duodenitis    GERD (gastroesophageal reflux disease)    Hemorrhoids    Orbit fracture, left (HCC)    Palpitations    Sciatica of right side    Tachycardia     Review of systems:  Otherwise negative.    Physical Exam  Gen: Alert, oriented. Appears stated age.  HEENT: [place]/AT. PERRLA. Lungs: CTA, no wheezes. CV: RR nl S1, S2. Abd: soft, benign, no masses. BS+ Ext: No edema. Pulses 2+    Planned procedures: Proceed with colonoscopy. The patient understands the nature of the planned procedure, indications, risks, alternatives and potential complications including but not limited to bleeding, infection, perforation, damage to internal organs and possible oversedation/side effects from anesthesia. The patient agrees and gives consent to proceed.  Please refer to procedure notes for findings, recommendations and patient disposition/instructions.     Bradley Barker Bradley Barker, M.D. Gastroenterology 08/25/2023  10:57 AM

## 2023-08-25 NOTE — Transfer of Care (Signed)
 Immediate Anesthesia Transfer of Care Note  Patient: Bradley Barker  Procedure(s) Performed: COLONOSCOPY WITH PROPOFOL   Patient Location: Endoscopy Unit  Anesthesia Type:General  Level of Consciousness: drowsy  Airway & Oxygen Therapy: Patient Spontanous Breathing  Post-op Assessment: Report given to RN and Post -op Vital signs reviewed and stable  Post vital signs: Reviewed and stable  Last Vitals:  Vitals Value Taken Time  BP 125/88 1156  Temp 35.8 1156  Pulse 74 1156  Resp 14 08/25/23 1156  SpO2 98 1156  Vitals shown include unfiled device data.  Last Pain:  Vitals:   08/25/23 1056  TempSrc: Temporal  PainSc: 0-No pain         Complications: No notable events documented.

## 2023-08-25 NOTE — Anesthesia Preprocedure Evaluation (Signed)
 Anesthesia Evaluation  Patient identified by MRN, date of birth, ID band Patient awake    Reviewed: Allergy & Precautions, NPO status , Patient's Chart, lab work & pertinent test results  History of Anesthesia Complications (+) PROLONGED EMERGENCE and history of anesthetic complications  Airway Mallampati: III  TM Distance: <3 FB Neck ROM: full    Dental  (+) Chipped   Pulmonary neg pulmonary ROS, neg shortness of breath, former smoker   Pulmonary exam normal        Cardiovascular Normal cardiovascular exam+ dysrhythmias      Neuro/Psych  PSYCHIATRIC DISORDERS       Neuromuscular disease    GI/Hepatic Neg liver ROS, PUD,GERD  Controlled,,  Endo/Other  negative endocrine ROS    Renal/GU negative Renal ROS  negative genitourinary   Musculoskeletal   Abdominal   Peds  Hematology negative hematology ROS (+)   Anesthesia Other Findings Past Medical History: No date: Allergic rhinitis No date: Anxiety No date: Arthritis No date: Complication of anesthesia     Comment:  HARD TO WAKE UP AFTER KNEE SURGERY (2005) No date: Depression No date: Duodenal ulcer 03/14/2015: Dysplastic nevus     Comment:  Right dorsum foot. Mild atypia, limited margins free. No date: Dysrhythmia     Comment:  H/O PALPITATIONS-ON PROPRANOLOL AND HAS CONTROLLED               PALPITATIONS No date: Family history of colonic polyps No date: Gastritis and duodenitis No date: GERD (gastroesophageal reflux disease) No date: Hemorrhoids No date: Orbit fracture, left (HCC) No date: Palpitations No date: Sciatica of right side No date: Tachycardia  Past Surgical History: No date: CARDIAC CATHETERIZATION No date: CARPAL TUNNEL RELEASE 03/04/2018: COLONOSCOPY WITH PROPOFOL ; N/A     Comment:  Procedure: COLONOSCOPY WITH PROPOFOL ;  Surgeon: Cassie Click, MD;  Location: [facility] ENDOSCOPY;  Service:               Endoscopy;   Laterality: N/A; No date: FRACTURE SURGERY; Left     Comment:  2007 No date: HERNIA REPAIR 02/13/2016: INGUINAL HERNIA REPAIR; Bilateral     Comment:  Procedure: LAPAROSCOPIC BILATERAL INGUINAL HERNIA               REPAIR;  Surgeon: Kandis Ormond, MD;  Location: [facility]              ORS;  Service: General;  Laterality: Bilateral; 02/13/2016: INSERTION OF MESH; N/A     Comment:  Procedure: INSERTION OF MESH;  Surgeon: Kandis Ormond, MD;  Location: [facility] ORS;  Service: General;                Laterality: N/A; No date: KNEE ARTHROSCOPY; Bilateral     Comment:  2002, 2005 No date: TIBIA FRACTURE SURGERY; Left No date: VASECTOMY  BMI    Body Mass Index: 25.30 kg/m      Reproductive/Obstetrics negative OB ROS                             Anesthesia Physical Anesthesia Plan  ASA: 3  Anesthesia Plan: General   Post-op Pain Management:    Induction: Intravenous  PONV Risk Score and Plan: Propofol  infusion and TIVA  Airway Management Planned: Natural Airway and Nasal Cannula  Additional Equipment:  Intra-op Plan:   Post-operative Plan:   Informed Consent: I have reviewed the patients History and Physical, chart, labs and discussed the procedure including the risks, benefits and alternatives for the proposed anesthesia with the patient or authorized representative who has indicated his/her understanding and acceptance.     Dental Advisory Given  Plan Discussed with: Anesthesiologist, CRNA and Surgeon  Anesthesia Plan Comments: (Patient consented for risks of anesthesia including but not limited to:  - adverse reactions to medications - risk of airway placement if required - damage to eyes, teeth, lips or other oral mucosa - nerve damage due to positioning  - sore throat or hoarseness - Damage to heart, brain, nerves, lungs, other parts of body or loss of life  Patient voiced understanding and assent.)        Anesthesia Quick Evaluation

## 2023-08-25 NOTE — Op Note (Signed)
 [facility] Gastroenterology Patient Name: Bradley Barker Procedure Date: 08/25/2023 11:35 AM MRN: [id number] Account #: [id number] Date of Birth: [birth year] Admit Type: Outpatient Age: 68 Room: [facility] ENDO ROOM 2 Gender: Male Note Status: Finalized Instrument Name: [employer] Main [id number] Procedure:             Colonoscopy Indications:           High risk colon cancer surveillance: Personal history                         of non-advanced adenoma Providers:             Gia Lusher K. Quintan Saldivar MD, MD Medicines:             Propofol  per Anesthesia Complications:         No immediate complications. Procedure:             Pre-Anesthesia Assessment:                        - The risks and benefits of the procedure and the                         sedation options and risks were discussed with the                         patient. All questions were answered and informed                         consent was obtained.                        - Patient identification and proposed procedure were                         verified prior to the procedure by the nurse. The                         procedure was verified in the procedure room.                        - After reviewing the risks and benefits, the patient                         was deemed in satisfactory condition to undergo the                         procedure.                        - ASA Grade Assessment: II - A patient with mild                         systemic disease.                        After obtaining informed consent, the colonoscope was                         passed under direct vision. Throughout the procedure,  the patient's blood pressure, pulse, and oxygen                         saturations were monitored continuously. The                         Colonoscope was introduced through the anus and                         advanced to the the cecum, identified by appendiceal                          orifice and ileocecal valve. The colonoscopy was                         performed without difficulty. The patient tolerated                         the procedure well. The quality of the bowel                         preparation was adequate. The ileocecal valve,                         appendiceal orifice, and rectum were photographed. Findings:      The perianal and digital rectal examinations were normal. Pertinent       negatives include normal sphincter tone and no palpable rectal lesions.      Non-bleeding internal hemorrhoids were found during retroflexion. The       hemorrhoids were Grade I (internal hemorrhoids that do not prolapse).      Many large-mouthed and small-mouthed diverticula were found in the       entire colon. There was no evidence of diverticular bleeding.      The exam was otherwise without abnormality. Impression:            - Non-bleeding internal hemorrhoids.                        - Mild diverticulosis in the entire examined colon.                         There was no evidence of diverticular bleeding.                        - The examination was otherwise normal.                        - No specimens collected. Recommendation:        - Patient has a contact number available for                         emergencies. The signs and symptoms of potential                         delayed complications were discussed with the patient.                         Return to normal activities tomorrow. Written  discharge instructions were provided to the patient.                        - Resume previous diet.                        - Continue present medications.                        - Repeat colonoscopy in 10 years for screening                         purposes.                        - Return to GI office PRN.                        - The findings and recommendations were discussed with                         the patient. Procedure Code(s):      --- Professional ---                        Z6109, Colorectal cancer screening; colonoscopy on                         individual at high risk Diagnosis Code(s):     --- Professional ---                        K57.30, Diverticulosis of large intestine without                         perforation or abscess without bleeding                        K64.0, First degree hemorrhoids                        Z86.010, Personal history of colonic polyps CPT copyright 2022 [employer]. All rights reserved. The codes documented in this report are preliminary and upon coder review may  be revised to meet current compliance requirements. Cassie Click MD, MD 08/25/2023 11:55:10 AM This report has been signed electronically. Number of Addenda: 0 Note Initiated On: 08/25/2023 11:35 AM Scope Withdrawal Time: 0 hours 6 minutes 7 seconds  Total Procedure Duration: 0 hours 12 minutes 4 seconds  Estimated Blood Loss:  Estimated blood loss: none.      [facility]

## 2023-08-25 NOTE — Interval H&P Note (Signed)
 History and Physical Interval Note:  08/25/2023 10:59 AM  Bradley Barker  has presented today for surgery, with the diagnosis of Z86.0101 (ICD-10-CM) - Hx of adenomatous colonic polyps.  The various methods of treatment have been discussed with the patient and family. After consideration of risks, benefits and other options for treatment, the patient has consented to  Procedure(s): COLONOSCOPY WITH PROPOFOL  (N/A) as a surgical intervention.  The patient's history has been reviewed, patient examined, no change in status, stable for surgery.  I have reviewed the patient's chart and labs.  Questions were answered to the patient's satisfaction.     [place], [name]

## 2023-08-26 ENCOUNTER — Encounter: Payer: Self-pay | Admitting: Internal Medicine

## 2023-08-26 NOTE — Anesthesia Postprocedure Evaluation (Signed)
 Anesthesia Post Note  Patient: Bradley Barker  Procedure(s) Performed: COLONOSCOPY WITH PROPOFOL   Patient location during evaluation: Endoscopy Anesthesia Type: General Level of consciousness: awake and alert Pain management: pain level controlled Vital Signs Assessment: post-procedure vital signs reviewed and stable Respiratory status: spontaneous breathing, nonlabored ventilation, respiratory function stable and patient connected to nasal cannula oxygen Cardiovascular status: blood pressure returned to baseline and stable Postop Assessment: no apparent nausea or vomiting Anesthetic complications: no   No notable events documented.   Last Vitals:  Vitals:   08/25/23 1156 08/25/23 1216  BP: 125/88 125/72  Pulse: 67   Resp:    Temp: (!) 35.9 C   SpO2: 97%     Last Pain:  Vitals:   08/26/23 0813  TempSrc:   PainSc: 0-No pain                 Portia Brittle Jami Bogdanski

## 2023-09-13 ENCOUNTER — Ambulatory Visit
Admission: RE | Admit: 2023-09-13 | Discharge: 2023-09-13 | Disposition: A | Discharge status: Home / Self Care | Source: Ambulatory Visit | Attending: Acute Care | Admitting: Acute Care

## 2023-09-13 DIAGNOSIS — Z122 Encounter for screening for malignant neoplasm of respiratory organs: Secondary | ICD-10-CM | POA: Diagnosis present

## 2023-09-13 DIAGNOSIS — Z87891 Personal history of nicotine dependence: Secondary | ICD-10-CM | POA: Diagnosis present

## 2023-09-22 ENCOUNTER — Ambulatory Visit
Attending: Student in an Organized Health Care Education/Training Program | Admitting: Student in an Organized Health Care Education/Training Program

## 2023-09-22 ENCOUNTER — Encounter: Payer: Self-pay | Admitting: Student in an Organized Health Care Education/Training Program

## 2023-09-22 ENCOUNTER — Ambulatory Visit
Admission: RE | Admit: 2023-09-22 | Discharge: 2023-09-22 | Disposition: A | Discharge status: Home / Self Care | Source: Ambulatory Visit | Attending: Student in an Organized Health Care Education/Training Program | Admitting: Student in an Organized Health Care Education/Training Program

## 2023-09-22 DIAGNOSIS — M47816 Spondylosis without myelopathy or radiculopathy, lumbar region: Secondary | ICD-10-CM

## 2023-09-22 DIAGNOSIS — G894 Chronic pain syndrome: Secondary | ICD-10-CM | POA: Insufficient documentation

## 2023-09-22 MED ORDER — LIDOCAINE HCL 2 % IJ SOLN
20.0000 mL | Freq: Once | INTRAMUSCULAR | Status: AC
  Administered 2023-09-22: 400 mg
  Filled 2023-09-22: qty 40

## 2023-09-22 MED ORDER — ROPIVACAINE HCL 2 MG/ML IJ SOLN
18.0000 mL | Freq: Once | INTRAMUSCULAR | Status: AC
  Administered 2023-09-22: 18 mL via PERINEURAL
  Filled 2023-09-22: qty 20

## 2023-09-22 MED ORDER — LACTATED RINGERS IV SOLN: Freq: Once | INTRAVENOUS | Status: AC

## 2023-09-22 MED ORDER — DEXAMETHASONE SODIUM PHOSPHATE 10 MG/ML IJ SOLN
20.0000 mg | Freq: Once | INTRAMUSCULAR | Status: AC
  Administered 2023-09-22: 20 mg
  Filled 2023-09-22: qty 2

## 2023-09-22 MED ORDER — MIDAZOLAM HCL 2 MG/2ML IJ SOLN
0.5000 mg | Freq: Once | INTRAMUSCULAR | Status: AC
  Administered 2023-09-22: 2 mg via INTRAVENOUS
  Filled 2023-09-22: qty 2

## 2023-09-22 NOTE — Progress Notes (Signed)
 PROVIDER NOTE: Interpretation of information contained herein should be left to medically-trained personnel. Specific patient instructions are provided elsewhere under "Patient Instructions" section of medical record. This document was created in part using STT-dictation technology, any transcriptional errors that may result from this process are unintentional.  Patient: Bradley Barker Type: Established DOB: 1955/12/28 MRN: [id number] PCP: Rory Collard, MD  Service: Procedure DOS: 09/22/2023 Setting: Ambulatory Location: Ambulatory outpatient facility Delivery: Face-to-face Provider: Cephus Collin, MD Specialty: Interventional Pain Management Specialty designation: 09 Location: Outpatient facility Ref. Prov.: Cephus Collin, MD       Interventional Therapy   Procedure: Lumbar Facet, Medial Branch Radiofrequency Ablation (RFA) #1  Laterality: Bilateral (-50)  Level: L3, L4, and L5 Medial Branch Level(s). These levels will denervate the L3-4 and L4-5 lumbar facet joints.  Imaging: Fluoroscopy-guided         Anesthesia: Local anesthesia (1-2% Lidocaine ) Sedation: Minimal Sedation                       DOS: 09/22/2023  Performed by: Cephus Collin, MD  Purpose: Therapeutic/Palliative Indications: Low back pain severe enough to impact quality of life or function. Indications: 1. Lumbar facet arthropathy   2. Chronic pain syndrome    Bradley Barker has been dealing with the above chronic pain for longer than three months and has either failed to respond, was unable to tolerate, or simply did not get enough benefit from other more conservative therapies including, but not limited to: 1. Over-the-counter medications 2. Anti-inflammatory medications 3. Muscle relaxants 4. Membrane stabilizers 5. Opioids 6. Physical therapy and/or chiropractic manipulation 7. Modalities (Heat, ice, etc.) 8. Invasive techniques such as nerve blocks. Bradley Barker has attained more than 50% relief of the pain from a  series of diagnostic injections conducted in separate occasions.  Pain Score: Pre-procedure: 2 /10 Post-procedure: 3 /10     Position / Prep / Materials:  Position: Prone  Prep solution: ChloraPrep (2% chlorhexidine  gluconate and 70% isopropyl alcohol) Prep Area: Entire Lumbosacral Region (Lower back from mid-thoracic region to end of tailbone and from flank to flank.) Materials:  Tray: RFA (Radiofrequency) tray Needle(s):  Type: RFA (Teflon-coated radiofrequency ablation needles) Gauge (G): 22  Length: Regular (10cm) Qty: 6     H&P (Pre-op Assessment):  Bradley Barker is a 68 y.o. (year old), male patient, seen today for interventional treatment. He  has a past surgical history that includes Knee arthroscopy (Bilateral); Carpal tunnel release; Hernia repair; Tibia fracture surgery (Left); Inguinal hernia repair (Bilateral, 02/13/2016); Insertion of mesh (N/A, 02/13/2016); Cardiac catheterization; Fracture surgery (Left); Vasectomy; Colonoscopy with propofol  (N/A, 03/04/2018); and Colonoscopy with propofol  (N/A, 08/25/2023). Bradley Barker has a current medication list which includes the following prescription(s): amitriptyline, arginine, fexofenadine, gabapentin, losartan, lysine, mupirocin  ointment, omega-3, omeprazole, propranolol, tadalafil, and vardenafil. His primarily concern today is the Back Pain (lower)  Initial Vital Signs:  Pulse/HCG Rate: 70ECG Heart Rate: 67 Temp: 97.6 F (36.4 C) Resp: 18 BP: 139/85 SpO2: 97 %  BMI: Estimated body mass index is 25.25 kg/m as calculated from the following:   Height as of this encounter: 6\' 3"  (1.905 m).   Weight as of this encounter: 202 lb (91.6 kg).  Risk Assessment: Allergies: Reviewed. He is allergic to cyclobenzaprine, omeprazole, other, and tramadol.  Allergy Precautions: None required Coagulopathies: Reviewed. None identified.  Blood-thinner therapy: None at this time Active Infection(s): Reviewed. None identified. Bradley Barker is  afebrile  Site Confirmation: Bradley Barker was asked to confirm the  procedure and laterality before marking the site Procedure checklist: Completed Consent: Before the procedure and under the influence of no sedative(s), amnesic(s), or anxiolytics, the patient was informed of the treatment options, risks and possible complications. To fulfill our ethical and legal obligations, as recommended by the American Medical Association's Code of Ethics, I have informed the patient of my clinical impression; the nature and purpose of the treatment or procedure; the risks, benefits, and possible complications of the intervention; the alternatives, including doing nothing; the risk(s) and benefit(s) of the alternative treatment(s) or procedure(s); and the risk(s) and benefit(s) of doing nothing. The patient was provided information about the general risks and possible complications associated with the procedure. These may include, but are not limited to: failure to achieve desired goals, infection, bleeding, organ or nerve damage, allergic reactions, paralysis, and death. In addition, the patient was informed of those risks and complications associated to Spine-related procedures, such as failure to decrease pain; infection (i.e.: Meningitis, epidural or intraspinal abscess); bleeding (i.e.: epidural hematoma, subarachnoid hemorrhage, or any other type of intraspinal or peri-dural bleeding); organ or nerve damage (i.e.: Any type of peripheral nerve, nerve root, or spinal cord injury) with subsequent damage to sensory, motor, and/or autonomic systems, resulting in permanent pain, numbness, and/or weakness of one or several areas of the body; allergic reactions; (i.e.: anaphylactic reaction); and/or death. Furthermore, the patient was informed of those risks and complications associated with the medications. These include, but are not limited to: allergic reactions (i.e.: anaphylactic or anaphylactoid reaction(s)); adrenal axis  suppression; blood sugar elevation that in diabetics may result in ketoacidosis or comma; water retention that in patients with history of congestive heart failure may result in shortness of breath, pulmonary edema, and decompensation with resultant heart failure; weight gain; swelling or edema; medication-induced neural toxicity; particulate matter embolism and blood vessel occlusion with resultant organ, and/or nervous system infarction; and/or aseptic necrosis of one or more joints. Finally, the patient was informed that Medicine is not an exact science; therefore, there is also the possibility of unforeseen or unpredictable risks and/or possible complications that may result in a catastrophic outcome. The patient indicated having understood very clearly. We have given the patient no guarantees and we have made no promises. Enough time was given to the patient to ask questions, all of which were answered to the patient's satisfaction. Mr. Decarlo has indicated that he wanted to continue with the procedure. Attestation: I, the ordering provider, attest that I have discussed with the patient the benefits, risks, side-effects, alternatives, likelihood of achieving goals, and potential problems during recovery for the procedure that I have provided informed consent. Date  Time: 09/22/2023  8:33 AM  Pre-Procedure Preparation:  Monitoring: As per clinic protocol. Respiration, ETCO2, SpO2, BP, heart rate and rhythm monitor placed and checked for adequate function Safety Precautions: Patient was assessed for positional comfort and pressure points before starting the procedure. Time-out: I initiated and conducted the "Time-out" before starting the procedure, as per protocol. The patient was asked to participate by confirming the accuracy of the "Time Out" information. Verification of the correct person, site, and procedure were performed and confirmed by me, the nursing staff, and the patient. "Time-out" conducted as  per Joint Commission's Universal Protocol (UP.01.01.01). Time: 0903 Start Time: 0903 hrs.  Description of Procedure:          Laterality: See above. Levels:  See above. Safety Precautions: Aspiration looking for blood return was conducted prior to all injections. At no point did  we inject any substances, as a needle was being advanced. Before injecting, the patient was told to immediately notify me if he was experiencing any new onset of "ringing in the ears, or metallic taste in the mouth". No attempts were made at seeking any paresthesias. Safe injection practices and needle disposal techniques used. Medications properly checked for expiration dates. SDV (single dose vial) medications used. After the completion of the procedure, all disposable equipment used was discarded in the proper designated medical waste containers. Local Anesthesia: Protocol guidelines were followed. The patient was positioned over the fluoroscopy table. The area was prepped in the usual manner. The time-out was completed. The target area was identified using fluoroscopy. A 12-in long, straight, sterile hemostat was used with fluoroscopic guidance to locate the targets for each level blocked. Once located, the skin was marked with an approved surgical skin marker. Once all sites were marked, the skin (epidermis, dermis, and hypodermis), as well as deeper tissues (fat, connective tissue and muscle) were infiltrated with a small amount of a short-acting local anesthetic, loaded on a 10cc syringe with a 25G, 1.5-in  Needle. An appropriate amount of time was allowed for local anesthetics to take effect before proceeding to the next step. Technical description of process:  Radiofrequency Ablation (RFA)  L3 Medial Branch Nerve RFA: The target area for the L3 medial branch is at the junction of the postero-lateral aspect of the superior articular process and the superior, posterior, and medial edge of the transverse process of L4.  Under fluoroscopic guidance, a Radiofrequency needle was inserted until contact was made with os over the superior postero-lateral aspect of the pedicular shadow (target area). Sensory and motor testing was conducted to properly adjust the position of the needle. Once satisfactory placement of the needle was achieved, the numbing solution was slowly injected after negative aspiration for blood. 2.0 mL of the nerve block solution was injected without difficulty or complication. After waiting for at least 3 minutes, the ablation was performed. Once completed, the needle was removed intact. L4 Medial Branch Nerve RFA: The target area for the L4 medial branch is at the junction of the postero-lateral aspect of the superior articular process and the superior, posterior, and medial edge of the transverse process of L5. Under fluoroscopic guidance, a Radiofrequency needle was inserted until contact was made with os over the superior postero-lateral aspect of the pedicular shadow (target area). Sensory and motor testing was conducted to properly adjust the position of the needle. Once satisfactory placement of the needle was achieved, the numbing solution was slowly injected after negative aspiration for blood. 2.0 mL of the nerve block solution was injected without difficulty or complication. After waiting for at least 3 minutes, the ablation was performed. Once completed, the needle was removed intact. L5 Medial Branch Nerve RFA: The target area for the L5 medial branch is at the junction of the postero-lateral aspect of the superior articular process of S1 and the superior, posterior, and medial edge of the sacral ala. Under fluoroscopic guidance, a Radiofrequency needle was inserted until contact was made with os over the superior postero-lateral aspect of the pedicular shadow (target area). Sensory and motor testing was conducted to properly adjust the position of the needle. Once satisfactory placement of the needle  was achieved, the numbing solution was slowly injected after negative aspiration for blood. 2.0 mL of the nerve block solution was injected without difficulty or complication. After waiting for at least 3 minutes, the ablation was performed. Once  completed, the needle was removed intact.  Radiofrequency lesioning (ablation):  Radiofrequency Generator: Medtronic AccurianTM AG 1000 RF Generator Sensory Stimulation Parameters: 50 Hz was used to locate & identify the nerve, making sure that the needle was positioned such that there was no sensory stimulation below 0.3 V or above 0.7 V. Motor Stimulation Parameters: 2 Hz was used to evaluate the motor component. Care was taken not to lesion any nerves that demonstrated motor stimulation of the lower extremities at an output of less than 2.5 times that of the sensory threshold, or a maximum of 2.0 V. Lesioning Technique Parameters: Standard Radiofrequency settings. (Not bipolar or pulsed.) Temperature Settings: 80 degrees C Lesioning time: 60 seconds Stationary intra-operative compliance: Compliant  Once the entire procedure was completed, the treated area was cleaned, making sure to leave some of the prepping solution back to take advantage of its long term bactericidal properties.    Illustration of the posterior view of the lumbar spine and the posterior neural structures. Laminae of L2 through S1 are labeled. DPRL5, dorsal primary ramus of L5; DPRS1, dorsal primary ramus of S1; DPR3, dorsal primary ramus of L3; FJ, facet (zygapophyseal) joint L3-L4; I, inferior articular process of L4; LB1, lateral branch of dorsal primary ramus of L1; IAB, inferior articular branches from L3 medial branch (supplies L4-L5 facet joint); IBP, intermediate branch plexus; MB3, medial branch of dorsal primary ramus of L3; NR3, third lumbar nerve root; S, superior articular process of L5; SAB, superior articular branches from L4 (supplies L4-5 facet joint also); TP3, transverse  process of L3.  Facet Joint Innervation (* possible contribution)  L1-2 T12, L1 (L2*)  Medial Branch  L2-3 L1, L2 (L3*)         "          "  L3-4 L2, L3 (L4*)         "          "  L4-5 L3, L4 (L5*)         "          "  L5-S1 L4, L5, S1          "          "    Vitals:   09/22/23 0915 09/22/23 0920 09/22/23 0925 09/22/23 0930  BP: (!) 144/115 (!) 148/114 (!) 151/120 (!) 139/101  Pulse:      Resp: (!) 22 18 18 18   Temp:      SpO2: 96% 95% 94% 100%  Weight:      Height:        Start Time: 0903 hrs. End Time: 0923 hrs.  Imaging Guidance (Spinal):          Type of Imaging Technique: Fluoroscopy Guidance (Spinal) Indication(s): Fluoroscopy guidance for needle placement to enhance accuracy in procedures requiring precise needle localization for targeted delivery of medication in or near specific anatomical locations not easily accessible without such real-time imaging assistance. Exposure Time: Please see nurses notes. Contrast: None used. Fluoroscopic Guidance: I was personally present during the use of fluoroscopy. "Tunnel Vision Technique" used to obtain the best possible view of the target area. Parallax error corrected before commencing the procedure. "Direction-depth-direction" technique used to introduce the needle under continuous pulsed fluoroscopy. Once target was reached, antero-posterior, oblique, and lateral fluoroscopic projection used confirm needle placement in all planes. Images permanently stored in EMR. Interpretation: No contrast injected. I personally interpreted the imaging intraoperatively. Adequate needle placement confirmed in multiple planes. Permanent images  saved into the patient's record.  Antibiotic Prophylaxis:   Anti-infectives (From admission, onward)    None      Indication(s): None identified   Post-operative Assessment:  Post-procedure Vital Signs:  Pulse/HCG Rate: 7070 Temp: 97.6 F (36.4 C) Resp: 18 BP: (!) 139/101 SpO2: 100  %  EBL: None  Complications: No immediate post-treatment complications observed by team, or reported by patient.  Note: The patient tolerated the entire procedure well. A repeat set of vitals were taken after the procedure and the patient was kept under observation following institutional policy, for this type of procedure. Post-procedural neurological assessment was performed, showing return to baseline, prior to discharge. The patient was provided with post-procedure discharge instructions, including a section on how to identify potential problems. Should any problems arise concerning this procedure, the patient was given instructions to immediately contact us , at any time, without hesitation. In any case, we plan to contact the patient by telephone for a follow-up status report regarding this interventional procedure.  Comments:  No additional relevant information.  Plan of Care (POC)  Orders:  Orders Placed This Encounter  Procedures   DG PAIN CLINIC C-ARM 1-60 MIN NO REPORT    Intraoperative interpretation by procedural physician at [facility].    Standing Status:   Standing    Number of Occurrences:   1    Reason for exam::   Assistance in needle guidance and placement for procedures requiring needle placement in or near specific anatomical locations not easily accessible without such assistance.    Medications ordered for procedure: Meds ordered this encounter  Medications   lidocaine  (XYLOCAINE ) 2 % (with pres) injection 400 mg   lactated ringers  infusion   midazolam  (VERSED ) injection 0.5-2 mg    Make sure Flumazenil is available in the pyxis when using this medication. If oversedation occurs, administer 0.2 mg IV over 15 sec. If after 45 sec no response, administer 0.2 mg again over 1 min; may repeat at 1 min intervals; not to exceed 4 doses (1 mg)   dexamethasone  (DECADRON ) injection 20 mg   ropivacaine  (PF) 2 mg/mL (0.2%) (NAROPIN ) injection 18 mL   Medications  administered: We administered lidocaine , lactated ringers , midazolam , dexamethasone , and ropivacaine  (PF) 2 mg/mL (0.2%).  See the medical record for exact dosing, route, and time of administration.  Follow-up plan:   Return in about 8 weeks (around 11/17/2023) for PPE, VV.       Right L4 and L5 TF ESI 12/07/22, BLF L3-5 02/10/23, 03/29/23, bilateral L3, L4, L5 RFA 09/22/2023       Recent Visits No visits were found meeting these conditions. Showing recent visits within past 90 days and meeting all other requirements Today's Visits Date Type Provider Dept  09/22/23 Procedure visit Cephus Collin, MD [facility]  Showing today's visits and meeting all other requirements Future Appointments Date Type Provider Dept  11/17/23 Appointment Cephus Collin, MD [facility]  Showing future appointments within next 90 days and meeting all other requirements  Disposition: Discharge home  Discharge (Date  Time): 09/22/2023; 0933 hrs.   Primary Care Physician: Rory Collard, MD Location: [facility] Note by: Cephus Collin, MD (TTS technology used. I apologize for any typographical errors that were not detected and corrected.) Date: 09/22/2023; Time: 10:20 AM  Disclaimer:  Medicine is not an Visual merchandiser. The only guarantee in medicine is that nothing is guaranteed. It is important to note that the decision to proceed with this intervention  was based on the information collected from the patient. The Data and conclusions were drawn from the patient's questionnaire, the interview, and the physical examination. Because the information was provided in large part by the patient, it cannot be guaranteed that it has not been purposely or unconsciously manipulated. Every effort has been made to obtain as much relevant data as possible for this evaluation. It is important to note that the conclusions that lead to this procedure are derived in large part from  the available data. Always take into account that the treatment will also be dependent on availability of resources and existing treatment guidelines, considered by other Pain Management Practitioners as being common knowledge and practice, at the time of the intervention. For Medico-Legal purposes, it is also important to point out that variation in procedural techniques and pharmacological choices are the acceptable norm. The indications, contraindications, technique, and results of the above procedure should only be interpreted and judged by a Board-Certified Interventional Pain Specialist with extensive familiarity and expertise in the same exact procedure and technique.

## 2023-09-22 NOTE — Patient Instructions (Signed)

## 2023-09-22 NOTE — Progress Notes (Signed)
 Safety precautions to be maintained throughout the outpatient stay will include: orient to surroundings, keep bed in low position, maintain call bell within reach at all times, provide assistance with transfer out of bed and ambulation.

## 2023-09-23 ENCOUNTER — Telehealth: Payer: Self-pay

## 2023-09-23 NOTE — Telephone Encounter (Signed)
 Post procedure follow up.  Patient states he is doing good today.

## 2023-09-29 ENCOUNTER — Ambulatory Visit: Admitting: Student in an Organized Health Care Education/Training Program

## 2023-10-06 ENCOUNTER — Other Ambulatory Visit: Payer: Self-pay

## 2023-10-06 DIAGNOSIS — Z122 Encounter for screening for malignant neoplasm of respiratory organs: Secondary | ICD-10-CM

## 2023-10-06 DIAGNOSIS — Z87891 Personal history of nicotine dependence: Secondary | ICD-10-CM

## 2023-11-17 ENCOUNTER — Telehealth: Admitting: Student in an Organized Health Care Education/Training Program

## 2023-11-18 ENCOUNTER — Encounter: Payer: Self-pay | Admitting: Student in an Organized Health Care Education/Training Program

## 2023-11-18 ENCOUNTER — Ambulatory Visit
Attending: Student in an Organized Health Care Education/Training Program | Admitting: Student in an Organized Health Care Education/Training Program

## 2023-11-18 VITALS — BP 136/94 | HR 62 | Temp 97.9°F | Resp 20 | Ht 75.0 in | Wt 202.0 lb

## 2023-11-18 DIAGNOSIS — M5116 Intervertebral disc disorders with radiculopathy, lumbar region: Secondary | ICD-10-CM | POA: Insufficient documentation

## 2023-11-18 DIAGNOSIS — G8929 Other chronic pain: Secondary | ICD-10-CM | POA: Diagnosis present

## 2023-11-18 DIAGNOSIS — M4726 Other spondylosis with radiculopathy, lumbar region: Secondary | ICD-10-CM | POA: Diagnosis not present

## 2023-11-18 DIAGNOSIS — M47816 Spondylosis without myelopathy or radiculopathy, lumbar region: Secondary | ICD-10-CM | POA: Diagnosis present

## 2023-11-18 DIAGNOSIS — M5416 Radiculopathy, lumbar region: Secondary | ICD-10-CM | POA: Insufficient documentation

## 2023-11-18 DIAGNOSIS — G894 Chronic pain syndrome: Secondary | ICD-10-CM | POA: Diagnosis present

## 2023-11-18 MED ORDER — CELECOXIB 100 MG PO CAPS: 100.0000 mg | ORAL_CAPSULE | Freq: Two times a day (BID) | ORAL | 0 refills | Status: AC

## 2023-11-18 NOTE — Progress Notes (Signed)
 PROVIDER NOTE: Interpretation of information contained herein should be left to medically-trained personnel. Specific patient instructions are provided elsewhere under Patient Instructions section of medical record. This document was created in part using AI and STT-dictation technology, any transcriptional errors that may result from this process are unintentional.  Patient: Bradley Barker  Service: E/M   PCP: Glover Lenis, MD  DOB: July 06, 1955  DOS: 11/18/2023  Provider: Wallie Sherry, MD  MRN: [id number]  Delivery: Face-to-face  Specialty: Interventional Pain Management  Type: Established Patient  Setting: Ambulatory outpatient facility  Specialty designation: 09  Referring Prov.: Glover Lenis, MD  Location: Outpatient office facility       History of present illness (HPI) [name], a 68 y.o. year old male, is here today because of his Lumbar facet arthropathy [M47.816]. Mr. Bubolz primary complain today is Back Pain (Lower back, radiating down to right leg to the top of the right foot )  Pertinent problems: Mr. Rathgeber does not have any pertinent problems on file.  Pain Assessment: Severity of Chronic pain is reported as a 5 /10. Location: Back Right/Pain radiates fown right hip, leg and top of right foot.. Onset: More than a month ago. Quality: Aching, Burning, Throbbing. Timing: Constant. Modifying factor(s): Heat, massage, repositing, medication. Vitals:  height is 6' 3 (1.905 m) and weight is 202 lb (91.6 kg). His temporal temperature is 97.9 F (36.6 C). His blood pressure is 136/94 (abnormal) and his pulse is 62. His respiration is 20 and oxygen saturation is 99%.  BMI: Estimated body mass index is 25.25 kg/m as calculated from the following:   Height as of this encounter: 6' 3 (1.905 m).   Weight as of this encounter: 202 lb (91.6 kg).  Last encounter: 04/12/2023. Last procedure: 09/22/2023.  Reason for encounter:   Discussed the use of AI scribe software for clinical note  transcription with the patient, who gave verbal consent to proceed.  History of Present Illness   Bradley Barker is a 68 year old male who presents with back pain after twisting while lifting an object.  He experienced back pain after twisting while lifting an object a couple of days ago. He immediately felt that he had 'messed up' his back. Since the incident, the pain has eased off a little but persists.  He has a history of undergoing a bilateral L3, L4, L5 lumbar RFA ablation procedure, which was effective in managing his symptoms previously. However, he is currently experiencing a recurrence of pain as a result of his twisting injury.  He is currently not on any medication for this issue.       ROS  Constitutional: Denies any fever or chills Gastrointestinal: No reported hemesis, hematochezia, vomiting, or acute GI distress Musculoskeletal: Denies any acute onset joint swelling, redness, loss of ROM, or weakness Neurological: No reported episodes of acute onset apraxia, aphasia, dysarthria, agnosia, amnesia, paralysis, loss of coordination, or loss of consciousness  Medication Review  Arginine, Lysine, Omega-3, amitriptyline, celecoxib , fexofenadine, losartan, propranolol, tadalafil, and vardenafil  History Review  Allergy: Bradley Barker is allergic to cyclobenzaprine, omeprazole, other, and tramadol. Drug: Bradley Barker  reports no history of drug use. Alcohol:  reports current alcohol use. Tobacco:  reports that he quit smoking about 12 years ago. His smoking use included cigarettes. He started smoking about 47 years ago. He has a 35 pack-year smoking history. He has never used smokeless tobacco. Social: Mr. Byrom  reports that he quit smoking about 12 years ago.  His smoking use included cigarettes. He started smoking about 47 years ago. He has a 35 pack-year smoking history. He has never used smokeless tobacco. He reports current alcohol use. He reports that he does not use drugs. Medical:  has  a past medical history of Allergic rhinitis, Anxiety, Arthritis, Complication of anesthesia, Depression, Duodenal ulcer, Dysplastic nevus (03/14/2015), Dysrhythmia, Family history of colonic polyps, Gastritis and duodenitis, GERD (gastroesophageal reflux disease), Hemorrhoids, Orbit fracture, left (HCC), Palpitations, Sciatica of right side, and Tachycardia. Surgical: Bradley Barker  has a past surgical history that includes Knee arthroscopy (Bilateral); Carpal tunnel release; Hernia repair; Tibia fracture surgery (Left); Inguinal hernia repair (Bilateral, 02/13/2016); Insertion of mesh (N/A, 02/13/2016); Cardiac catheterization; Fracture surgery (Left); Vasectomy; Colonoscopy with propofol  (N/A, 03/04/2018); and Colonoscopy with propofol  (N/A, 08/25/2023). Family: family history includes Diabetes in his brother; Heart attack in his father; Heart disease in his father; Kidney cancer in his mother; Ovarian cancer in his sister.  Laboratory Chemistry Profile   Renal Lab Results  Component Value Date   BUN 18 01/23/2016   CREATININE 0.87 01/23/2016   GFRAA >60 01/23/2016   GFRNONAA >60 01/23/2016    Hepatic Lab Results  Component Value Date   AST 16 01/23/2016   ALT 22 01/23/2016   ALBUMIN 4.5 01/23/2016   ALKPHOS 82 01/23/2016    Electrolytes Lab Results  Component Value Date   NA 138 01/23/2016   K 3.8 01/23/2016   CL 102 01/23/2016   CALCIUM 10.1 01/23/2016    Bone No results found for: VD25OH, VD125OH2TOT, CI6874NY7, CI7874NY7, 25OHVITD1, 25OHVITD2, 25OHVITD3, TESTOFREE, TESTOSTERONE  Inflammation (CRP: Acute Phase) (ESR: Chronic Phase) Lab Results  Component Value Date   ESRSEDRATE 3 03/30/2013         Note: Above Lab results reviewed.  Recent Imaging Review  CT CHEST LUNG CA SCREEN LOW DOSE W/O CM CLINICAL DATA:  Former 35 pack-year smoker, quit 2014.  EXAM: CT CHEST WITHOUT CONTRAST LOW-DOSE FOR LUNG CANCER SCREENING  TECHNIQUE: Multidetector CT imaging  of the chest was performed following the standard protocol without IV contrast.  RADIATION DOSE REDUCTION: This exam was performed according to the departmental dose-optimization program which includes automated exposure control, adjustment of the mA and/or kV according to patient size and/or use of iterative reconstruction technique.  COMPARISON:  08/03/2022.  FINDINGS: Cardiovascular: Heart size normal.  No pericardial effusion.  Mediastinum/Nodes: No pathologically enlarged mediastinal or axillary lymph nodes. Hilar regions are difficult to definitively evaluate without IV contrast. Esophagus is grossly unremarkable.  Lungs/Pleura: Centrilobular and paraseptal emphysema. Scattered small juxtapleural nodules are considered benign. Pulmonary nodules otherwise measure 14.5 mm or less in size, as before. No new pulmonary nodules. No pleural fluid. Airway is unremarkable.  Upper Abdomen: Visualized portions of the liver, gallbladder, adrenal glands, kidneys, spleen, pancreas, stomach and bowel are grossly unremarkable. No upper abdominal adenopathy.  Musculoskeletal: Minimal degenerative change in the spine.  IMPRESSION: 1. Lung-RADS 2, benign appearance or behavior. Continue annual screening with low-dose chest CT without contrast in 12 months. 2.  Emphysema (ICD10-J43.9).  Electronically Signed   By: Newell Eke M.D.   On: 10/06/2023 11:17 Note: Reviewed        Physical Exam  Vitals: BP (!) 136/94 (BP Location: Left Arm, Patient Position: Sitting)   Pulse 62   Temp 97.9 F (36.6 C) (Temporal)   Resp 20   Ht 6' 3 (1.905 m)   Wt 202 lb (91.6 kg)   SpO2 99%   BMI 25.25 kg/m  BMI: Estimated body mass index is 25.25 kg/m as calculated from the following:   Height as of this encounter: 6' 3 (1.905 m).   Weight as of this encounter: 202 lb (91.6 kg). Ideal: Ideal body weight: 84.5 kg (186 lb 4.6 oz) Adjusted ideal body weight: 87.4 kg (192 lb 9.2 oz) General  appearance: Well nourished, well developed, and well hydrated. In no apparent acute distress Mental status: Alert, oriented x 3 (person, place, & time)       Respiratory: No evidence of acute respiratory distress Eyes: PERLA Low back pain, worse with lateral rotation  Assessment   1. Lumbar facet arthropathy   2. Lumbar disc herniation with radiculopathy   3. Chronic radicular lumbar pain   4. Chronic pain syndrome       Updated Problems: No problems updated.  Plan of Care  Assessment and Plan    Back pain   He experienced an acute exacerbation of back pain after a twisting motion while lifting an object. The pain has eased slightly since onset. Celebrex  is preferred over steroids due to its lower side effect profile, avoiding potential issues like insomnia, increased blood sugar, and blood pressure. Short-term use of Celebrex  is unlikely to cause kidney issues. Surgery is not indicated unless there is treatment failure or diminishing returns from ablations. Initiate a 30-day trial of Celebrex , taken twice daily after meals with plenty of water. Monitor his response to Celebrex  over the next month. He should avoid movements or positions that previously provoked pain. Consider further intervention if ablation results diminish or symptoms worsen.        Pharmacotherapy (Medications Ordered): Meds ordered this encounter  Medications   celecoxib  (CELEBREX ) 100 MG capsule    Sig: Take 1 capsule (100 mg total) by mouth 2 (two) times daily after a meal.    Dispense:  60 capsule    Refill:  0   Orders:  No orders of the defined types were placed in this encounter.    Right L4 and L5 TF ESI 12/07/22, BLF L3-5 02/10/23, 03/29/23, bilateral L3, L4, L5 RFA 09/22/2023       No follow-ups on file.    Recent Visits Date Type Provider Dept  09/22/23 Procedure visit Marcelino Nurse, MD [facility]  Showing recent visits within past 90 days and meeting all other  requirements Today's Visits Date Type Provider Dept  11/18/23 Office Visit Marcelino Nurse, MD [facility]  Showing today's visits and meeting all other requirements Future Appointments No visits were found meeting these conditions. Showing future appointments within next 90 days and meeting all other requirements  I discussed the assessment and treatment plan with the patient. The patient was provided an opportunity to ask questions and all were answered. The patient agreed with the plan and demonstrated an understanding of the instructions.  Patient advised to call back or seek an in-person evaluation if the symptoms or condition worsens.  Duration of encounter: 20 minutes.  Total time on encounter, as per AMA guidelines included both the face-to-face and non-face-to-face time personally spent by the physician and/or other qualified health care professional(s) on the day of the encounter (includes time in activities that require the physician or other qualified health care professional and does not include time in activities normally performed by clinical staff). Physician's time may include the following activities when performed: Preparing to see the patient (e.g., pre-charting review of records, searching for previously ordered imaging, lab work, and nerve conduction tests) Review  of prior analgesic pharmacotherapies. Reviewing PMP Interpreting ordered tests (e.g., lab work, imaging, nerve conduction tests) Performing post-procedure evaluations, including interpretation of diagnostic procedures Obtaining and/or reviewing separately obtained history Performing a medically appropriate examination and/or evaluation Counseling and educating the patient/family/caregiver Ordering medications, tests, or procedures Referring and communicating with other health care professionals (when not separately reported) Documenting clinical information in the electronic or other health  record Independently interpreting results (not separately reported) and communicating results to the patient/ family/caregiver Care coordination (not separately reported)  Note by: Wallie Sherry, MD (TTS and AI technology used. I apologize for any typographical errors that were not detected and corrected.) Date: 11/18/2023; Time: 11:14 AM

## 2023-11-18 NOTE — Progress Notes (Signed)
 Safety precautions to be maintained throughout the outpatient stay will include: orient to surroundings, keep bed in low position, maintain call bell within reach at all times, provide assistance with transfer out of bed and ambulation.

## 2023-12-11 ENCOUNTER — Encounter: Payer: Self-pay | Admitting: Student in an Organized Health Care Education/Training Program

## 2023-12-17 ENCOUNTER — Encounter: Payer: Self-pay | Admitting: Student in an Organized Health Care Education/Training Program

## 2023-12-17 DIAGNOSIS — M47816 Spondylosis without myelopathy or radiculopathy, lumbar region: Secondary | ICD-10-CM

## 2023-12-17 DIAGNOSIS — G8929 Other chronic pain: Secondary | ICD-10-CM

## 2023-12-17 DIAGNOSIS — M5116 Intervertebral disc disorders with radiculopathy, lumbar region: Secondary | ICD-10-CM

## 2023-12-20 MED ORDER — PREDNISONE 20 MG PO TABS: ORAL_TABLET | ORAL | 0 refills | Status: AC

## 2024-01-26 ENCOUNTER — Ambulatory Visit: Payer: Medicare Other | Admitting: Dermatology

## 2024-03-07 ENCOUNTER — Encounter: Payer: Self-pay | Admitting: Dermatology

## 2024-03-07 ENCOUNTER — Ambulatory Visit: Admitting: Dermatology

## 2024-03-07 DIAGNOSIS — L814 Other melanin hyperpigmentation: Secondary | ICD-10-CM

## 2024-03-07 DIAGNOSIS — Z1283 Encounter for screening for malignant neoplasm of skin: Secondary | ICD-10-CM

## 2024-03-07 DIAGNOSIS — L578 Other skin changes due to chronic exposure to nonionizing radiation: Secondary | ICD-10-CM

## 2024-03-07 DIAGNOSIS — I781 Nevus, non-neoplastic: Secondary | ICD-10-CM

## 2024-03-07 DIAGNOSIS — L820 Inflamed seborrheic keratosis: Secondary | ICD-10-CM | POA: Diagnosis not present

## 2024-03-07 DIAGNOSIS — L821 Other seborrheic keratosis: Secondary | ICD-10-CM

## 2024-03-07 DIAGNOSIS — W908XXA Exposure to other nonionizing radiation, initial encounter: Secondary | ICD-10-CM | POA: Diagnosis not present

## 2024-03-07 DIAGNOSIS — D229 Melanocytic nevi, unspecified: Secondary | ICD-10-CM

## 2024-03-07 DIAGNOSIS — D1801 Hemangioma of skin and subcutaneous tissue: Secondary | ICD-10-CM

## 2024-03-07 DIAGNOSIS — Z86018 Personal history of other benign neoplasm: Secondary | ICD-10-CM

## 2024-03-07 DIAGNOSIS — L816 Other disorders of diminished melanin formation: Secondary | ICD-10-CM

## 2024-03-07 NOTE — Progress Notes (Unsigned)
 Follow-Up Visit   Subjective  Bradley Barker is a 68 y.o. male who presents for the following: Skin Cancer Screening and Full Body Skin Exam hx of Dysplastic nevus  The patient presents for Total-Body Skin Exam (TBSE) for skin cancer screening and mole check. The patient has spots, moles and lesions to be evaluated, some may be new or changing and the patient may have concern these could be cancer.  The following portions of the chart were reviewed this encounter and updated as appropriate: medications, allergies, medical history  Review of Systems:  No other skin or systemic complaints except as noted in HPI or Assessment and Plan.  Objective  Well appearing patient in no apparent distress; mood and affect are within normal limits.  A full examination was performed including scalp, head, eyes, ears, nose, lips, neck, chest, axillae, abdomen, back, buttocks, bilateral upper extremities, bilateral lower extremities, hands, feet, fingers, toes, fingernails, and toenails. All findings within normal limits unless otherwise noted below.   Relevant physical exam findings are noted in the Assessment and Plan.  face, neck, arms, shoulders x 12, L ant thigh x 1, L knee x 1 (14) Stuck on waxy paps with erythema  Assessment & Plan   SKIN CANCER SCREENING PERFORMED TODAY.  ACTINIC DAMAGE - Chronic condition, secondary to cumulative UV/sun exposure - diffuse scaly erythematous macules with underlying dyspigmentation - Recommend daily broad spectrum sunscreen SPF 30+ to sun-exposed areas, reapply every 2 hours as needed.  - Staying in the shade or wearing long sleeves, sun glasses (UVA+UVB protection) and wide brim hats (4-inch brim around the entire circumference of the hat) are also recommended for sun protection.  - Call for new or changing lesions.  LENTIGINES, SEBORRHEIC KERATOSES, HEMANGIOMAS - Benign normal skin lesions - Benign-appearing - Call for any changes  MELANOCYTIC NEVI -  Tan-brown and/or pink-flesh-colored symmetric macules and papules - Benign appearing on exam today - Observation - Call clinic for new or changing moles - Recommend daily use of broad spectrum spf 30+ sunscreen to sun-exposed areas.   HISTORY OF DYSPLASTIC NEVUS No evidence of recurrence today Recommend regular full body skin exams Recommend daily broad spectrum sunscreen SPF 30+ to sun-exposed areas, reapply every 2 hours as needed.  Call if any new or changing lesions are noted between office visits  - R dorsum foot  TELANGIECTASIA chest Exam: dilated blood vessel(s) Treatment Plan: Benign appearing on exam Call for changes   Idiopathic Guttate Hypomelanosis (IGH) Exam: scattered small hypopigmented macules IGH is a benign chronic condition of sun-exposed skin, most commonly affecting the arms and legs. Cause is unknown but it can be a genetic trait. It is not related to vitiligo. There is no treatment. Recommend photoprotection and regular use of spf 30 or higher sunscreen which may help prevent the development of more white spots. Treatment Plan:  Benign, observe.    INFLAMED SEBORRHEIC KERATOSIS (14) face, neck, arms, shoulders x 12, L ant thigh x 1, L knee x 1 (14) Symptomatic, irritating, patient would like treated. Destruction of lesion - face, neck, arms, shoulders x 12, L ant thigh x 1, L knee x 1 (14) Complexity: simple   Destruction method: cryotherapy   Informed consent: discussed and consent obtained   Timeout:  patient name, date of birth, surgical site, and procedure verified Lesion destroyed using liquid nitrogen: Yes   Region frozen until ice ball extended beyond lesion: Yes   Outcome: patient tolerated procedure well with no complications  Post-procedure details: wound care instructions given    Return for 1-2 years for TBSE, Hx of Dysplastic nevi.  I, Grayce Saunas, RMA, am acting as scribe for Alm Rhyme, MD .   Documentation: I have reviewed the  above documentation for accuracy and completeness, and I agree with the above.  Alm Rhyme, MD

## 2024-03-07 NOTE — Patient Instructions (Addendum)

## 2024-03-08 ENCOUNTER — Encounter: Payer: Self-pay | Admitting: Dermatology

## 2025-02-13 ENCOUNTER — Ambulatory Visit: Admitting: Dermatology
